# Patient Record
Sex: Male | Born: 1955 | Race: White | Hispanic: No | Marital: Single | Smoking: Never smoker
Health system: Southern US, Community
[De-identification: ages and names within clinical notes are randomized; demographics above are authoritative.]

## PROBLEM LIST (undated history)

## (undated) DIAGNOSIS — E785 Hyperlipidemia, unspecified: Secondary | ICD-10-CM

## (undated) DIAGNOSIS — I10 Essential (primary) hypertension: Secondary | ICD-10-CM

## (undated) HISTORY — DX: Essential (primary) hypertension: I10

## (undated) HISTORY — DX: Hyperlipidemia, unspecified: E78.5

---

## 2009-09-13 ENCOUNTER — Emergency Department: Payer: Self-pay | Admitting: Emergency Medicine

## 2010-05-19 ENCOUNTER — Ambulatory Visit: Payer: Self-pay | Admitting: Gastroenterology

## 2010-05-21 LAB — PATHOLOGY REPORT

## 2011-06-29 IMAGING — CR DG CHEST 2V
1 series · 2 of 2 positions shown · non-contrast
Comparison: none

REASON FOR EXAM: cough, congestion
COMMENTS:   May transport without cardiac monitor

PROCEDURE:     DXR - DXR CHEST PA (OR AP) AND LATERAL  - September 13, 2009  [DATE]
RESULT:     The lung fields are clear. The heart, mediastinal and osseous
structures show no acute changes. Incidental note is made of a slight
thoracic scoliosis with convexity to the right.

[Series 1: view not recorded · 0.17mm/px · 2 of 2 slices shown]
[im 1/2]
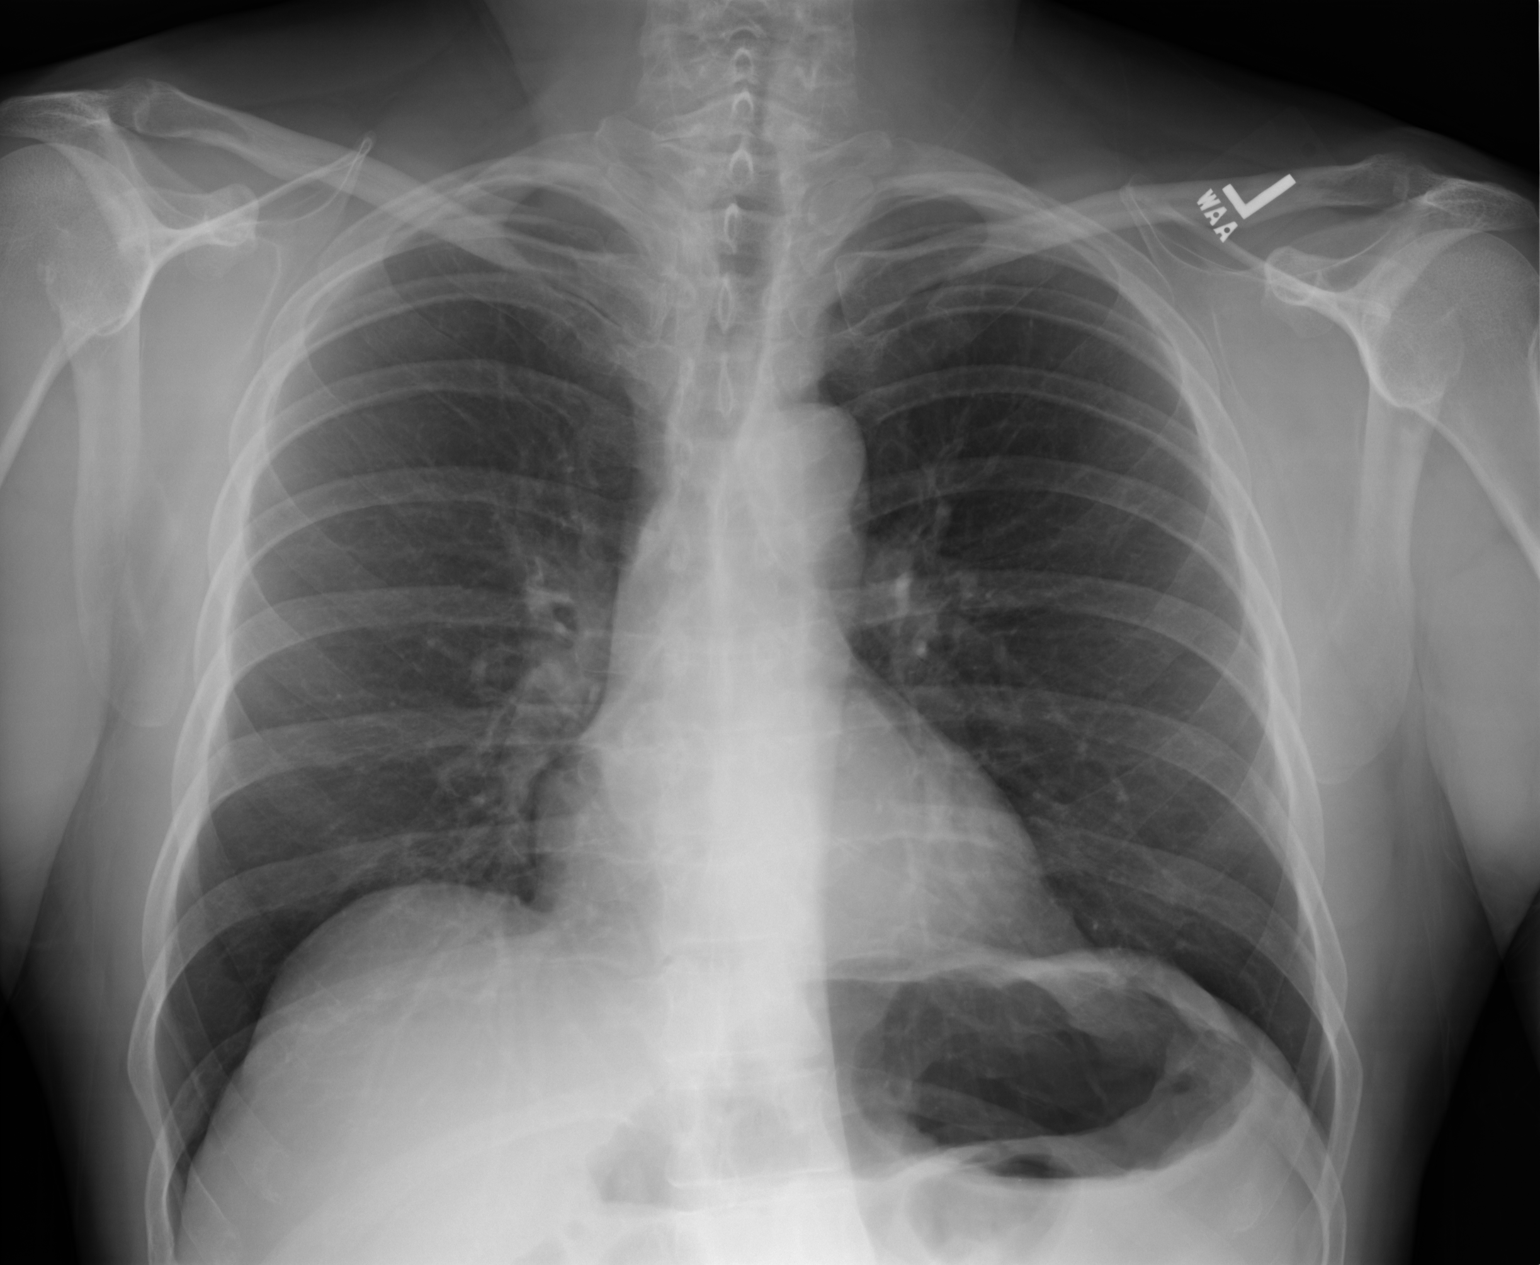
[im 2/2]
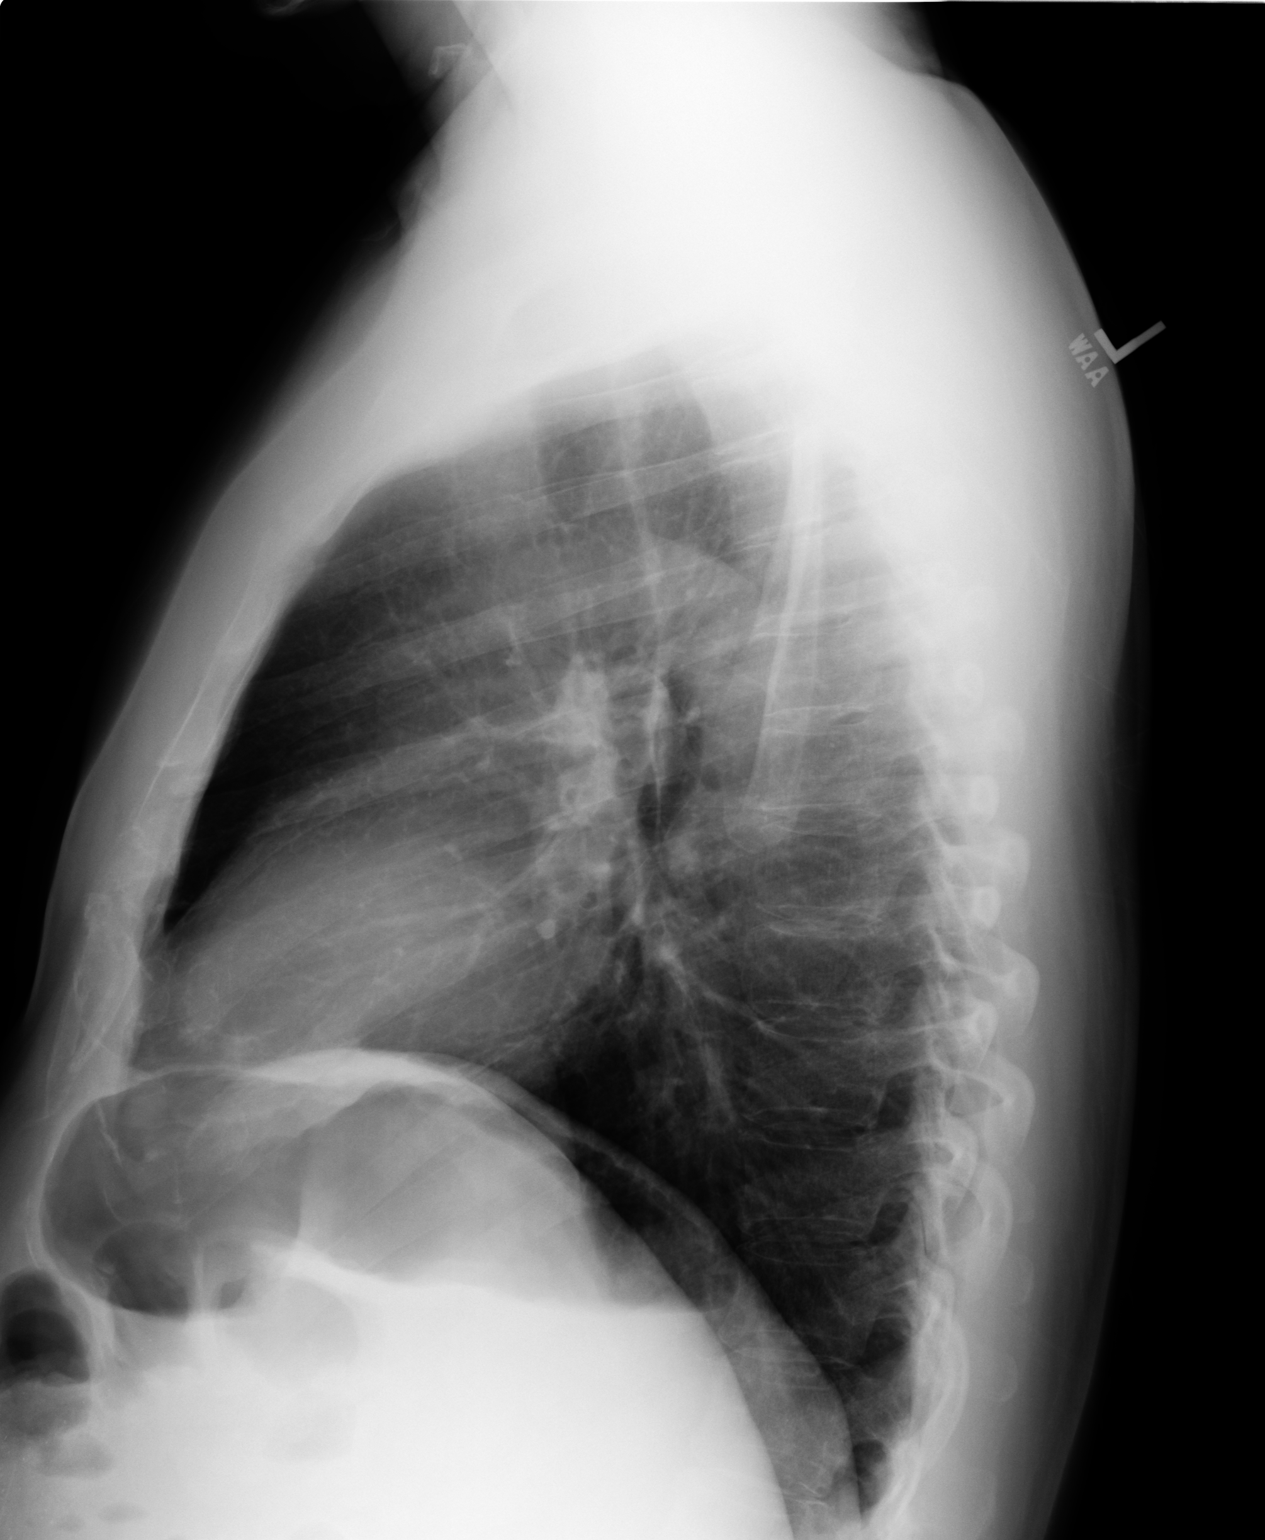

[2 of 2 positions shown; findings below may reference images not displayed]

IMPRESSION: No acute changes are identified.

## 2013-07-04 ENCOUNTER — Ambulatory Visit: Payer: Self-pay | Admitting: Gastroenterology

## 2013-07-04 LAB — HM COLONOSCOPY

## 2013-07-07 LAB — PATHOLOGY REPORT

## 2014-07-06 LAB — PSA

## 2014-09-24 DIAGNOSIS — I10 Essential (primary) hypertension: Secondary | ICD-10-CM | POA: Insufficient documentation

## 2015-03-16 ENCOUNTER — Telehealth: Payer: Self-pay

## 2015-03-16 NOTE — Telephone Encounter (Signed)
I will be doing labs at his physical in February.  I don't think I also want labs on this visit.

## 2015-03-16 NOTE — Telephone Encounter (Signed)
Called and let patient know Elnita MaxwellCheryl would not be doing labs at visit in December.

## 2015-03-16 NOTE — Telephone Encounter (Signed)
Patient came in and stated that he needs lab orders mailed to him. Patient is coming in for 6 month f/u on 04/12/15 and practice partner number is [phone]. Elnita MaxwellCheryl, can you put in orders for this patient and then I will print them and mail them to him please?

## 2015-04-02 DIAGNOSIS — I10 Essential (primary) hypertension: Secondary | ICD-10-CM | POA: Insufficient documentation

## 2015-04-02 DIAGNOSIS — E782 Mixed hyperlipidemia: Secondary | ICD-10-CM | POA: Insufficient documentation

## 2015-04-02 DIAGNOSIS — E785 Hyperlipidemia, unspecified: Secondary | ICD-10-CM

## 2015-04-12 ENCOUNTER — Ambulatory Visit (INDEPENDENT_AMBULATORY_CARE_PROVIDER_SITE_OTHER): Payer: PRIVATE HEALTH INSURANCE | Admitting: Unknown Physician Specialty

## 2015-04-12 ENCOUNTER — Encounter: Payer: Self-pay | Admitting: Unknown Physician Specialty

## 2015-04-12 VITALS — BP 178/84 | HR 65 | Temp 98.5°F | Ht 65.8 in | Wt 152.6 lb

## 2015-04-12 DIAGNOSIS — I10 Essential (primary) hypertension: Secondary | ICD-10-CM | POA: Diagnosis not present

## 2015-04-12 DIAGNOSIS — E785 Hyperlipidemia, unspecified: Secondary | ICD-10-CM

## 2015-04-12 NOTE — Progress Notes (Signed)
BP 178/84 mmHg  Pulse 65  Temp(Src) 98.5 F (36.9 C)  Ht 5' 5.8" (1.671 m)  Wt 152 lb 9.6 oz (69.219 kg)  BMI 24.79 kg/m2  SpO2 99%   Subjective:    Patient ID: Warren Richardson, male    DOB: 1955-10-14, 59 y.o.   MRN: [id number]  HPI: Warren Richardson is a 59 y.o. male  Chief Complaint  Patient presents with  . Hyperlipidemia  . Hypertension   Hypertension Using medications without difficulty Average home BPs 130's/80s  No problems or lightheadedness No chest pain with exertion or shortness of breath No Edema  Relevant past medical, surgical, family and social history reviewed and updated as indicated. Interim medical history since our last visit reviewed. Allergies and medications reviewed and updated.  Review of Systems  Per HPI unless specifically indicated above     Objective:    BP 178/84 mmHg  Pulse 65  Temp(Src) 98.5 F (36.9 C)  Ht 5' 5.8" (1.671 m)  Wt 152 lb 9.6 oz (69.219 kg)  BMI 24.79 kg/m2  SpO2 99%  Wt Readings from Last 3 Encounters:  04/12/15 152 lb 9.6 oz (69.219 kg)  10/06/14 148 lb (67.132 kg)    Physical Exam  Constitutional: He is oriented to person, place, and time. He appears well-developed and well-nourished. No distress.  HENT:  Head: Normocephalic and atraumatic.  Eyes: Conjunctivae and lids are normal. Right eye exhibits no discharge. Left eye exhibits no discharge. No scleral icterus.  Neck: Normal range of motion. Neck supple. No JVD present. Carotid bruit is not present.  Cardiovascular: Normal rate, regular rhythm and normal heart sounds.   Pulmonary/Chest: Effort normal and breath sounds normal. No respiratory distress.  Abdominal: Normal appearance. There is no splenomegaly or hepatomegaly.  Musculoskeletal: Normal range of motion.  Neurological: He is alert and oriented to person, place, and time.  Skin: Skin is warm, dry and intact. No rash noted. No pallor.  Psychiatric: He has a normal mood and affect. His  behavior is normal. Judgment and thought content normal.      Assessment & Plan:   Problem List Items Addressed This Visit      Unprioritized   Hypertension - Primary    BP very high here in the office but good BP at home.  We will recheck in 1 month.  Bring in cuff for correlation.  No labs today.  Check BP daily      Relevant Medications   aspirin 81 MG chewable tablet   Hyperlipidemia    Not fasting.  Recheck next month      Relevant Medications   aspirin 81 MG chewable tablet     Order written for labs   Follow up plan: Return in about 4 weeks (around 05/10/2015).

## 2015-04-12 NOTE — Assessment & Plan Note (Addendum)
BP very high here in the office but good BP at home.  We will recheck in 1 month.  Bring in cuff for correlation.  No labs today.  Check BP daily

## 2015-04-12 NOTE — Assessment & Plan Note (Signed)
Not fasting.  Recheck next month

## 2015-05-12 ENCOUNTER — Other Ambulatory Visit: Payer: Self-pay

## 2015-05-12 DIAGNOSIS — Z299 Encounter for prophylactic measures, unspecified: Secondary | ICD-10-CM

## 2015-05-12 NOTE — Progress Notes (Signed)
Patient came in to have blood drawn per [facility]  Blood was drawn from the right arm without any incident.

## 2015-05-13 LAB — COMPREHENSIVE METABOLIC PANEL
A/G RATIO: 2.3 (ref 1.1–2.5)
ALBUMIN: 4.9 g/dL (ref 3.5–5.5)
ALT: 12 IU/L (ref 0–44)
AST: 37 IU/L (ref 0–40)
Alkaline Phosphatase: 65 IU/L (ref 39–117)
BILIRUBIN TOTAL: 0.4 mg/dL (ref 0.0–1.2)
BUN / CREAT RATIO: 16 (ref 9–20)
BUN: 18 mg/dL (ref 6–24)
CHLORIDE: 95 mmol/L — AB (ref 96–106)
CO2: 26 mmol/L (ref 18–29)
Calcium: 10.1 mg/dL (ref 8.7–10.2)
Creatinine, Ser: 1.1 mg/dL (ref 0.76–1.27)
GFR calc non Af Amer: 73 mL/min/{1.73_m2} (ref >59)
GFR, EST AFRICAN AMERICAN: 84 mL/min/{1.73_m2} (ref >59)
GLOBULIN, TOTAL: 2.1 g/dL (ref 1.5–4.5)
Glucose: 101 mg/dL — ABNORMAL HIGH (ref 65–99)
POTASSIUM: 4.9 mmol/L (ref 3.5–5.2)
SODIUM: 139 mmol/L (ref 134–144)
TOTAL PROTEIN: 7 g/dL (ref 6.0–8.5)

## 2015-05-13 LAB — CBC WITH DIFFERENTIAL/PLATELET
BASOS: 0 %
Basophils Absolute: 0 10*3/uL (ref 0.0–0.2)
EOS (ABSOLUTE): 0.1 10*3/uL (ref 0.0–0.4)
Eos: 1 %
Hematocrit: 41.3 % (ref 37.5–51.0)
Hemoglobin: 14.2 g/dL (ref 12.6–17.7)
IMMATURE GRANS (ABS): 0 10*3/uL (ref 0.0–0.1)
Immature Granulocytes: 0 %
LYMPHS ABS: 1.1 10*3/uL (ref 0.7–3.1)
LYMPHS: 26 %
MCH: 33 pg (ref 26.6–33.0)
MCHC: 34.4 g/dL (ref 31.5–35.7)
MCV: 96 fL (ref 79–97)
Monocytes Absolute: 0.5 10*3/uL (ref 0.1–0.9)
Monocytes: 12 %
NEUTROS ABS: 2.5 10*3/uL (ref 1.4–7.0)
Neutrophils: 61 %
PLATELETS: 214 10*3/uL (ref 150–379)
RBC: 4.3 x10E6/uL (ref 4.14–5.80)
RDW: 13.6 % (ref 12.3–15.4)
WBC: 4.1 10*3/uL (ref 3.4–10.8)

## 2015-05-13 LAB — MICROALBUMIN / CREATININE URINE RATIO
Creatinine, Urine: 131.3 mg/dL
MICROALB/CREAT RATIO: 2.7 mg/g creat (ref 0.0–30.0)
Microalbumin, Urine: 3.6 ug/mL

## 2015-05-13 LAB — URIC ACID: Uric Acid: 7.6 mg/dL (ref 3.7–8.6)

## 2015-05-17 ENCOUNTER — Ambulatory Visit: Payer: PRIVATE HEALTH INSURANCE | Admitting: Unknown Physician Specialty

## 2015-05-19 ENCOUNTER — Encounter: Payer: Self-pay | Admitting: Unknown Physician Specialty

## 2015-05-19 ENCOUNTER — Ambulatory Visit (INDEPENDENT_AMBULATORY_CARE_PROVIDER_SITE_OTHER): Payer: Managed Care, Other (non HMO) | Admitting: Unknown Physician Specialty

## 2015-05-19 VITALS — BP 162/85 | HR 65 | Temp 98.2°F | Ht 65.7 in | Wt 153.0 lb

## 2015-05-19 DIAGNOSIS — I10 Essential (primary) hypertension: Secondary | ICD-10-CM

## 2015-05-19 NOTE — Assessment & Plan Note (Addendum)
Apparent white coat hypertension.  Discussed relationship of ETOH with BP.

## 2015-05-19 NOTE — Progress Notes (Signed)
BP 162/85 mmHg  Pulse 65  Temp(Src) 98.2 F (36.8 C)  Ht 5' 5.7" (1.669 m)  Wt 153 lb (69.4 kg)  BMI 24.91 kg/m2  SpO2 98%   Subjective:    Patient ID: Warren Richardson, male    DOB: 1956/04/02, 60 y.o.   MRN: [id number]  HPI: Warren Richardson is a 60 y.o. male  Chief Complaint  Patient presents with  . Hypertension    pt states he has been checking BP at home and states it has been all over the place.   Hypertension Using medications without difficulty Average home BPs somewhat erratic but typically below 140/80.  Brought BP cuff in today for correlation with office BP   No problems or lightheadedness No chest pain with exertion or shortness of breath No Edema     Relevant past medical, surgical, family and social history reviewed and updated as indicated. Interim medical history since our last visit reviewed. Allergies and medications reviewed and updated.  Review of Systems  Per HPI unless specifically indicated above     Objective:    BP 162/85 mmHg  Pulse 65  Temp(Src) 98.2 F (36.8 C)  Ht 5' 5.7" (1.669 m)  Wt 153 lb (69.4 kg)  BMI 24.91 kg/m2  SpO2 98%  Wt Readings from Last 3 Encounters:  05/19/15 153 lb (69.4 kg)  04/12/15 152 lb 9.6 oz (69.219 kg)  10/06/14 148 lb (67.132 kg)    Home cuff pressure high as well.    Physical Exam  Constitutional: He is oriented to person, place, and time. He appears well-developed and well-nourished. No distress.  HENT:  Head: Normocephalic and atraumatic.  Eyes: Conjunctivae and lids are normal. Right eye exhibits no discharge. Left eye exhibits no discharge. No scleral icterus.  Neck: Normal range of motion. Neck supple. No JVD present. Carotid bruit is not present.  Cardiovascular: Normal rate, regular rhythm and normal heart sounds.   Pulmonary/Chest: Effort normal and breath sounds normal. No respiratory distress.  Abdominal: Normal appearance. There is no splenomegaly or hepatomegaly.   Musculoskeletal: Normal range of motion.  Neurological: He is alert and oriented to person, place, and time.  Skin: Skin is warm, dry and intact. No rash noted. No pallor.  Psychiatric: He has a normal mood and affect. His behavior is normal. Judgment and thought content normal.    Results for orders placed or performed in visit on 05/12/15  Microalbumin / creatinine urine ratio  Result Value Ref Range   Creatinine, Urine 131.3 Not Estab. mg/dL   Microalbum.,U,Random 3.6 Not Estab. ug/mL   MICROALB/CREAT RATIO 2.7 0.0 - 30.0 mg/g creat  Comp Met (CMET)  Result Value Ref Range   Glucose 101 (H) 65 - 99 mg/dL   BUN 18 6 - 24 mg/dL   Creatinine, Ser 7.84 0.76 - 1.27 mg/dL   GFR calc non Af Amer 73 >59 mL/min/1.73   GFR calc Af Amer 84 >59 mL/min/1.73   BUN/Creatinine Ratio 16 9 - 20   Sodium 139 134 - 144 mmol/L   Potassium 4.9 3.5 - 5.2 mmol/L   Chloride 95 (L) 96 - 106 mmol/L   CO2 26 18 - 29 mmol/L   Calcium 10.1 8.7 - 10.2 mg/dL   Total Protein 7.0 6.0 - 8.5 g/dL   Albumin 4.9 3.5 - 5.5 g/dL   Globulin, Total 2.1 1.5 - 4.5 g/dL   Albumin/Globulin Ratio 2.3 1.1 - 2.5   Bilirubin Total 0.4 0.0 - 1.2 mg/dL  Alkaline Phosphatase 65 39 - 117 IU/L   AST 37 0 - 40 IU/L   ALT 12 0 - 44 IU/L  Uric acid  Result Value Ref Range   Uric Acid 7.6 3.7 - 8.6 mg/dL  CBC with Differential  Result Value Ref Range   WBC 4.1 3.4 - 10.8 x10E3/uL   RBC 4.30 4.14 - 5.80 x10E6/uL   Hemoglobin 14.2 12.6 - 17.7 g/dL   Hematocrit 40.9 81.1 - 51.0 %   MCV 96 79 - 97 fL   MCH 33.0 26.6 - 33.0 pg   MCHC 34.4 31.5 - 35.7 g/dL   RDW 91.4 78.2 - 95.6 %   Platelets 214 150 - 379 x10E3/uL   Neutrophils 61 %   Lymphs 26 %   Monocytes 12 %   Eos 1 %   Basos 0 %   Neutrophils Absolute 2.5 1.4 - 7.0 x10E3/uL   Lymphocytes Absolute 1.1 0.7 - 3.1 x10E3/uL   Monocytes Absolute 0.5 0.1 - 0.9 x10E3/uL   EOS (ABSOLUTE) 0.1 0.0 - 0.4 x10E3/uL   Basophils Absolute 0.0 0.0 - 0.2 x10E3/uL   Immature  Granulocytes 0 %   Immature Grans (Abs) 0.0 0.0 - 0.1 x10E3/uL      Assessment & Plan:   Problem List Items Addressed This Visit      Unprioritized   Hypertension - Primary    Apparent white coat hypertension.  Discussed relationship of ETOH with BP.            Follow up plan: Return for physical in March.  Marland Kitchen

## 2015-07-09 ENCOUNTER — Ambulatory Visit (INDEPENDENT_AMBULATORY_CARE_PROVIDER_SITE_OTHER): Payer: Managed Care, Other (non HMO) | Admitting: Unknown Physician Specialty

## 2015-07-09 ENCOUNTER — Encounter: Payer: Self-pay | Admitting: Unknown Physician Specialty

## 2015-07-09 VITALS — BP 120/74 | HR 64 | Temp 98.0°F | Ht 65.2 in | Wt 150.6 lb

## 2015-07-09 DIAGNOSIS — I10 Essential (primary) hypertension: Secondary | ICD-10-CM

## 2015-07-09 DIAGNOSIS — E785 Hyperlipidemia, unspecified: Secondary | ICD-10-CM | POA: Diagnosis not present

## 2015-07-09 DIAGNOSIS — Z0000 Encounter for general adult medical examination without abnormal findings: Secondary | ICD-10-CM | POA: Diagnosis not present

## 2015-07-09 DIAGNOSIS — R7301 Impaired fasting glucose: Secondary | ICD-10-CM

## 2015-07-09 NOTE — Assessment & Plan Note (Signed)
Stable, continue present medications.   

## 2015-07-09 NOTE — Progress Notes (Signed)
BP 120/74 mmHg  Pulse 64  Temp(Src) 98 F (36.7 C)  Ht 5' 5.2" (1.656 m)  Wt 150 lb 9.6 oz (68.312 kg)  BMI 24.91 kg/m2  SpO2 100%   Subjective:    Patient ID: Warren Richardson, male    DOB: 1955/09/18, 60 y.o.   MRN: [id number]  HPI: Warren Richardson is a 60 y.o. male  Chief Complaint  Patient presents with  . Annual Exam   Hypertension Using medications without difficulty: yes Average home BPs: 110-120/ 60-70s  No problems or lightheadedness No chest pain with exertion or shortness of breath No Edema  Hyperlipidemia Using medications without problems: yes No Muscle aches  Diet compliance: Eats home-cooked meals and occasionally will eat out on weekend. Does not eat added sugar at all. No sweets.                                               Exercise: Does cardio on treadmill and bike every day and lifts weights 3 days a week  Patient refused HIV and Hep C screening.   Relevant past medical, surgical, family and social history reviewed and updated as indicated. Interim medical history since our last visit reviewed. Allergies and medications reviewed and updated.  Review of Systems  Constitutional: Negative.   HENT: Negative.   Eyes: Negative.   Respiratory: Negative.   Cardiovascular: Negative.   Gastrointestinal: Negative.   Endocrine: Negative.   Genitourinary: Negative.   Skin: Negative.   Allergic/Immunologic: Negative.   Neurological: Negative.   Hematological: Negative.   Psychiatric/Behavioral: Negative.     Per HPI unless specifically indicated above     Objective:    BP 120/74 mmHg  Pulse 64  Temp(Src) 98 F (36.7 C)  Ht 5' 5.2" (1.656 m)  Wt 150 lb 9.6 oz (68.312 kg)  BMI 24.91 kg/m2  SpO2 100%  Wt Readings from Last 3 Encounters:  07/09/15 150 lb 9.6 oz (68.312 kg)  05/19/15 153 lb (69.4 kg)  04/12/15 152 lb 9.6 oz (69.219 kg)    Physical Exam  Constitutional: He is oriented to person, place, and time. He appears  well-developed and well-nourished.  HENT:  Head: Normocephalic.  Right Ear: Tympanic membrane, external ear and ear canal normal.  Left Ear: Tympanic membrane, external ear and ear canal normal.  Mouth/Throat: Uvula is midline, oropharynx is clear and moist and mucous membranes are normal.  Eyes: Pupils are equal, round, and reactive to light.  Cardiovascular: Normal rate, regular rhythm and normal heart sounds.  Exam reveals no gallop and no friction rub.   No murmur heard. Pulmonary/Chest: Effort normal and breath sounds normal. No respiratory distress.  Abdominal: Soft. Bowel sounds are normal. He exhibits no distension. There is no tenderness.  Musculoskeletal: Normal range of motion.  Neurological: He is alert and oriented to person, place, and time. He has normal reflexes.  Skin: Skin is warm and dry.  Psychiatric: He has a normal mood and affect. His behavior is normal. Judgment and thought content normal.    Results for orders placed or performed in visit on 05/12/15  Microalbumin / creatinine urine ratio  Result Value Ref Range   Creatinine, Urine 131.3 Not Estab. mg/dL   Microalbum.,U,Random 3.6 Not Estab. ug/mL   MICROALB/CREAT RATIO 2.7 0.0 - 30.0 mg/g creat  Comp Met (CMET)  Result Value Ref Range  Glucose 101 (H) 65 - 99 mg/dL   BUN 18 6 - 24 mg/dL   Creatinine, Ser 2.95 0.76 - 1.27 mg/dL   GFR calc non Af Amer 73 >59 mL/min/1.73   GFR calc Af Amer 84 >59 mL/min/1.73   BUN/Creatinine Ratio 16 9 - 20   Sodium 139 134 - 144 mmol/L   Potassium 4.9 3.5 - 5.2 mmol/L   Chloride 95 (L) 96 - 106 mmol/L   CO2 26 18 - 29 mmol/L   Calcium 10.1 8.7 - 10.2 mg/dL   Total Protein 7.0 6.0 - 8.5 g/dL   Albumin 4.9 3.5 - 5.5 g/dL   Globulin, Total 2.1 1.5 - 4.5 g/dL   Albumin/Globulin Ratio 2.3 1.1 - 2.5   Bilirubin Total 0.4 0.0 - 1.2 mg/dL   Alkaline Phosphatase 65 39 - 117 IU/L   AST 37 0 - 40 IU/L   ALT 12 0 - 44 IU/L  Uric acid  Result Value Ref Range   Uric Acid 7.6  3.7 - 8.6 mg/dL  CBC with Differential  Result Value Ref Range   WBC 4.1 3.4 - 10.8 x10E3/uL   RBC 4.30 4.14 - 5.80 x10E6/uL   Hemoglobin 14.2 12.6 - 17.7 g/dL   Hematocrit 62.1 30.8 - 51.0 %   MCV 96 79 - 97 fL   MCH 33.0 26.6 - 33.0 pg   MCHC 34.4 31.5 - 35.7 g/dL   RDW 65.7 84.6 - 96.2 %   Platelets 214 150 - 379 x10E3/uL   Neutrophils 61 %   Lymphs 26 %   Monocytes 12 %   Eos 1 %   Basos 0 %   Neutrophils Absolute 2.5 1.4 - 7.0 x10E3/uL   Lymphocytes Absolute 1.1 0.7 - 3.1 x10E3/uL   Monocytes Absolute 0.5 0.1 - 0.9 x10E3/uL   EOS (ABSOLUTE) 0.1 0.0 - 0.4 x10E3/uL   Basophils Absolute 0.0 0.0 - 0.2 x10E3/uL   Immature Granulocytes 0 %   Immature Grans (Abs) 0.0 0.0 - 0.1 x10E3/uL      Assessment & Plan:   Problem List Items Addressed This Visit      Unprioritized   Hypertension    Stable, continue present medications.        Hyperlipidemia    Other Visit Diagnoses    Annual physical exam    -  Primary    IFG (impaired fasting glucose)            Follow up plan: Return in about 6 months (around 01/09/2016).

## 2015-07-17 ENCOUNTER — Other Ambulatory Visit: Payer: Self-pay | Admitting: Unknown Physician Specialty

## 2015-08-18 ENCOUNTER — Other Ambulatory Visit: Payer: Self-pay | Admitting: Unknown Physician Specialty

## 2015-12-18 ENCOUNTER — Other Ambulatory Visit: Payer: Self-pay | Admitting: Unknown Physician Specialty

## 2016-01-17 ENCOUNTER — Other Ambulatory Visit: Payer: Self-pay | Admitting: Unknown Physician Specialty

## 2016-01-17 ENCOUNTER — Other Ambulatory Visit: Payer: Self-pay

## 2016-01-17 DIAGNOSIS — Z299 Encounter for prophylactic measures, unspecified: Secondary | ICD-10-CM

## 2016-01-17 NOTE — Progress Notes (Signed)
Patient came in to have blood drawn for testing per Dr. Crissman's orders. 

## 2016-01-18 LAB — CMP12+LP+TP+TSH+6AC+PSA+CBC…
A/G RATIO: 2.2 (ref 1.2–2.2)
ALBUMIN: 4.7 g/dL (ref 3.6–4.8)
ALT: 11 IU/L (ref 0–44)
AST: 31 IU/L (ref 0–40)
Alkaline Phosphatase: 67 IU/L (ref 39–117)
BASOS: 0 %
BILIRUBIN TOTAL: 0.7 mg/dL (ref 0.0–1.2)
BUN / CREAT RATIO: 19 (ref 10–24)
BUN: 21 mg/dL (ref 8–27)
Basophils Absolute: 0 10*3/uL (ref 0.0–0.2)
CHOL/HDL RATIO: 2.4 ratio (ref 0.0–5.0)
Calcium: 9.3 mg/dL (ref 8.6–10.2)
Chloride: 95 mmol/L — ABNORMAL LOW (ref 96–106)
Cholesterol, Total: 207 mg/dL — ABNORMAL HIGH (ref 100–199)
Creatinine, Ser: 1.11 mg/dL (ref 0.76–1.27)
EOS (ABSOLUTE): 0.1 10*3/uL (ref 0.0–0.4)
EOS: 2 %
Free Thyroxine Index: 1.7 (ref 1.2–4.9)
GFR calc non Af Amer: 72 mL/min/{1.73_m2} (ref >59)
GFR, EST AFRICAN AMERICAN: 83 mL/min/{1.73_m2} (ref >59)
GGT: 24 IU/L (ref 0–65)
GLOBULIN, TOTAL: 2.1 g/dL (ref 1.5–4.5)
GLUCOSE: 98 mg/dL (ref 65–99)
HDL: 85 mg/dL (ref >39)
HEMATOCRIT: 40.1 % (ref 37.5–51.0)
HEMOGLOBIN: 13.8 g/dL (ref 12.6–17.7)
IMMATURE GRANULOCYTES: 0 %
Immature Grans (Abs): 0 10*3/uL (ref 0.0–0.1)
Iron: 182 ug/dL — ABNORMAL HIGH (ref 38–169)
LDH: 174 IU/L (ref 121–224)
LDL CALC: 113 mg/dL — AB (ref 0–99)
LYMPHS ABS: 1.2 10*3/uL (ref 0.7–3.1)
Lymphs: 25 %
MCH: 32.1 pg (ref 26.6–33.0)
MCHC: 34.4 g/dL (ref 31.5–35.7)
MCV: 93 fL (ref 79–97)
MONOCYTES: 10 %
Monocytes Absolute: 0.5 10*3/uL (ref 0.1–0.9)
NEUTROS PCT: 63 %
Neutrophils Absolute: 3 10*3/uL (ref 1.4–7.0)
PHOSPHORUS: 3.3 mg/dL (ref 2.5–4.5)
PROSTATE SPECIFIC AG, SERUM: 2.7 ng/mL (ref 0.0–4.0)
Platelets: 237 10*3/uL (ref 150–379)
Potassium: 4.7 mmol/L (ref 3.5–5.2)
RBC: 4.3 x10E6/uL (ref 4.14–5.80)
RDW: 14.4 % (ref 12.3–15.4)
SODIUM: 138 mmol/L (ref 134–144)
T3 Uptake Ratio: 26 % (ref 24–39)
T4, Total: 6.7 ug/dL (ref 4.5–12.0)
TOTAL PROTEIN: 6.8 g/dL (ref 6.0–8.5)
TSH: 1.96 u[IU]/mL (ref 0.450–4.500)
Triglycerides: 47 mg/dL (ref 0–149)
Uric Acid: 7.4 mg/dL (ref 3.7–8.6)
VLDL Cholesterol Cal: 9 mg/dL (ref 5–40)
WBC: 4.8 10*3/uL (ref 3.4–10.8)

## 2016-01-18 LAB — HEPATITIS C ANTIBODY (REFLEX)

## 2016-01-18 LAB — MICROALBUMIN / CREATININE URINE RATIO
CREATININE, UR: 90.5 mg/dL
MICROALB/CREAT RATIO: 10.6 mg/g creat (ref 0.0–30.0)
MICROALBUM., U, RANDOM: 9.6 ug/mL

## 2016-01-18 LAB — HGB A1C W/O EAG: HEMOGLOBIN A1C: 5.3 % (ref 4.8–5.6)

## 2016-01-18 LAB — HIV ANTIBODY (ROUTINE TESTING W REFLEX): HIV Screen 4th Generation wRfx: NONREACTIVE

## 2016-01-18 LAB — HCV COMMENT:

## 2016-01-21 ENCOUNTER — Ambulatory Visit (INDEPENDENT_AMBULATORY_CARE_PROVIDER_SITE_OTHER): Payer: Managed Care, Other (non HMO) | Admitting: Unknown Physician Specialty

## 2016-01-21 ENCOUNTER — Encounter: Payer: Self-pay | Admitting: Unknown Physician Specialty

## 2016-01-21 VITALS — BP 129/74 | HR 62 | Temp 97.9°F | Ht 67.2 in | Wt 151.8 lb

## 2016-01-21 DIAGNOSIS — E785 Hyperlipidemia, unspecified: Secondary | ICD-10-CM | POA: Diagnosis not present

## 2016-01-21 DIAGNOSIS — Z0000 Encounter for general adult medical examination without abnormal findings: Secondary | ICD-10-CM

## 2016-01-21 DIAGNOSIS — I10 Essential (primary) hypertension: Secondary | ICD-10-CM | POA: Diagnosis not present

## 2016-01-21 NOTE — Progress Notes (Signed)
BP 129/74 (BP Location: Left Arm, Patient Position: Sitting, Cuff Size: Normal)   Pulse 62   Temp 97.9 F (36.6 C)   Ht 5' 7.2" (1.707 m) Comment: pt had shoes on  Wt 151 lb 12.8 oz (68.9 kg) Comment: pt had shoes on  SpO2 100%   BMI 23.63 kg/m    Subjective:    Patient ID: Warren Richardson, male    DOB: 1955-10-15, 60 y.o.   MRN: [id number]  HPI: Warren Richardson is a 60 y.o. male  Chief Complaint  Patient presents with  . Hyperlipidemia  . Hypertension   Hypertension Using medications without difficulty Average home BPs below 130/80  No problems or lightheadedness No chest pain with exertion or shortness of breath No Edema   Hyperlipidemia Using medications without problems No Muscle aches  Diet compliance: watches what he eats Exercise: daily  Relevant past medical, surgical, family and social history reviewed and updated as indicated. Interim medical history since our last visit reviewed. Allergies and medications reviewed and updated.  Review of Systems  Per HPI unless specifically indicated above     Objective:    BP 129/74 (BP Location: Left Arm, Patient Position: Sitting, Cuff Size: Normal)   Pulse 62   Temp 97.9 F (36.6 C)   Ht 5' 7.2" (1.707 m) Comment: pt had shoes on  Wt 151 lb 12.8 oz (68.9 kg) Comment: pt had shoes on  SpO2 100%   BMI 23.63 kg/m   Wt Readings from Last 3 Encounters:  01/21/16 151 lb 12.8 oz (68.9 kg)  07/09/15 150 lb 9.6 oz (68.3 kg)  05/19/15 153 lb (69.4 kg)    Physical Exam  Constitutional: He is oriented to person, place, and time. He appears well-developed and well-nourished. No distress.  HENT:  Head: Normocephalic and atraumatic.  Eyes: Conjunctivae and lids are normal. Right eye exhibits no discharge. Left eye exhibits no discharge. No scleral icterus.  Neck: Normal range of motion. Neck supple. No JVD present. Carotid bruit is not present.  Cardiovascular: Normal rate, regular rhythm and normal heart  sounds.   Pulmonary/Chest: Effort normal and breath sounds normal. No respiratory distress.  Abdominal: Normal appearance. There is no splenomegaly or hepatomegaly.  Musculoskeletal: Normal range of motion.  Neurological: He is alert and oriented to person, place, and time.  Skin: Skin is warm, dry and intact. No rash noted. No pallor.  Psychiatric: He has a normal mood and affect. His behavior is normal. Judgment and thought content normal.    Results for orders placed or performed in visit on 01/17/16  Hepatitis C antibody (reflex)  Result Value Ref Range   HCV Ab <0.1 0.0 - 0.9 s/co ratio  Hemoglobin A1c  Result Value Ref Range   Hgb A1c MFr Bld 5.3 4.8 - 5.6 %  Microalbumin / creatinine urine ratio  Result Value Ref Range   Creatinine, Urine 90.5 Not Estab. mg/dL   Microalbum.,U,Random 9.6 Not Estab. ug/mL   MICROALB/CREAT RATIO 10.6 0.0 - 30.0 mg/g creat  HIV antibody (with reflex)  Result Value Ref Range   HIV Screen 4th Generation wRfx Non Reactive Non Reactive  Male Executive Panel  Result Value Ref Range   Glucose 98 65 - 99 mg/dL   Uric Acid 7.4 3.7 - 8.6 mg/dL   BUN 21 8 - 27 mg/dL   Creatinine, Ser 2.95 0.76 - 1.27 mg/dL   GFR calc non Af Amer 72 >59 mL/min/1.73   GFR calc Af Amer 83 >  59 mL/min/1.73   BUN/Creatinine Ratio 19 10 - 24   Sodium 138 134 - 144 mmol/L   Potassium 4.7 3.5 - 5.2 mmol/L   Chloride 95 (L) 96 - 106 mmol/L   Calcium 9.3 8.6 - 10.2 mg/dL   Phosphorus 3.3 2.5 - 4.5 mg/dL   Total Protein 6.8 6.0 - 8.5 g/dL   Albumin 4.7 3.6 - 4.8 g/dL   Globulin, Total 2.1 1.5 - 4.5 g/dL   Albumin/Globulin Ratio 2.2 1.2 - 2.2   Bilirubin Total 0.7 0.0 - 1.2 mg/dL   Alkaline Phosphatase 67 39 - 117 IU/L   LDH 174 121 - 224 IU/L   AST 31 0 - 40 IU/L   ALT 11 0 - 44 IU/L   GGT 24 0 - 65 IU/L   Iron 182 (H) 38 - 169 ug/dL   Cholesterol, Total 161 (H) 100 - 199 mg/dL   Triglycerides 47 0 - 149 mg/dL   HDL 85 >09 mg/dL   VLDL Cholesterol Cal 9 5 - 40 mg/dL     LDL Calculated 604 (H) 0 - 99 mg/dL   Chol/HDL Ratio 2.4 0.0 - 5.0 ratio units   Estimated CHD Risk  < 0.5 0.0 - 1.0  times avg.   TSH 1.960 0.450 - 4.500 uIU/mL   T4, Total 6.7 4.5 - 12.0 ug/dL   T3 Uptake Ratio 26 24 - 39 %   Free Thyroxine Index 1.7 1.2 - 4.9   Prostate Specific Ag, Serum 2.7 0.0 - 4.0 ng/mL   WBC 4.8 3.4 - 10.8 x10E3/uL   RBC 4.30 4.14 - 5.80 x10E6/uL   Hemoglobin 13.8 12.6 - 17.7 g/dL   Hematocrit 54.0 98.1 - 51.0 %   MCV 93 79 - 97 fL   MCH 32.1 26.6 - 33.0 pg   MCHC 34.4 31.5 - 35.7 g/dL   RDW 19.1 47.8 - 29.5 %   Platelets 237 150 - 379 x10E3/uL   Neutrophils 63 %   Lymphs 25 %   Monocytes 10 %   Eos 2 %   Basos 0 %   Neutrophils Absolute 3.0 1.4 - 7.0 x10E3/uL   Lymphocytes Absolute 1.2 0.7 - 3.1 x10E3/uL   Monocytes Absolute 0.5 0.1 - 0.9 x10E3/uL   EOS (ABSOLUTE) 0.1 0.0 - 0.4 x10E3/uL   Basophils Absolute 0.0 0.0 - 0.2 x10E3/uL   Immature Granulocytes 0 %   Immature Grans (Abs) 0.0 0.0 - 0.1 x10E3/uL  HCV Comment:  Result Value Ref Range   Comment: Comment       Assessment & Plan:   Problem List Items Addressed This Visit      Unprioritized   Hyperlipidemia    LDL is 113.  Continue present medications      Hypertension    Stable, continue present medications.         Other Visit Diagnoses   None.      Follow up plan: Return in about 6 months (around 07/20/2016) for for physical.

## 2016-01-21 NOTE — Assessment & Plan Note (Signed)
LDL is 113.  Continue present medications

## 2016-01-21 NOTE — Assessment & Plan Note (Signed)
Stable, continue present medications.   

## 2016-03-19 ENCOUNTER — Other Ambulatory Visit: Payer: Self-pay | Admitting: Unknown Physician Specialty

## 2016-04-25 ENCOUNTER — Ambulatory Visit: Payer: Self-pay | Admitting: Physician Assistant

## 2016-04-25 ENCOUNTER — Encounter: Payer: Self-pay | Admitting: Physician Assistant

## 2016-04-25 VITALS — BP 141/81 | HR 91 | Temp 98.1°F

## 2016-04-25 DIAGNOSIS — R252 Cramp and spasm: Secondary | ICD-10-CM

## 2016-04-25 NOTE — Progress Notes (Signed)
Subjective:muscle cramps    Patient ID: Warren Richardson, male    DOB: 15-Nov-1955, 60 y.o.   MRN: [id number]  HPI Patient c/o muscle cramps to legs last night. States cramping episode caused tingling in groin area and sweating. Awake this morning with mild tingling. Denies loss of Bladder or bowel control. Patient take Pravachol and was advised that cramping might be a side affect. Patient states Family doctor prescribed Potassium. He does not know the dosage. States at times he is non-compliance.   Review of Systems Hypertension and Hyperlipidema.    Objective:   Physical Exam No acute distress. HEENT unremarkable. Neck supple. Lungs CTA, and Heart RRR. Normal gait and strength of lower extremities. No palpable leg masses.       Assessment & Plan:Muscle cramp.  To lab for CK-MB and BMP. Follow up for lab results. Advised if another severe muscle cramp to report to ED.

## 2016-04-25 NOTE — Addendum Note (Signed)
Addended by: Catha BrowEACON, Zhoey Blackstock T on: 04/25/2016 03:47 PM   Modules accepted: Orders

## 2016-04-26 LAB — CREATININE KINASE MB: CK-MB Index: 3.4 ng/mL (ref 0.0–10.4)

## 2016-04-26 LAB — COMPREHENSIVE METABOLIC PANEL
ALBUMIN: 4.5 g/dL (ref 3.6–4.8)
ALT: 16 IU/L (ref 0–44)
AST: 32 IU/L (ref 0–40)
Albumin/Globulin Ratio: 2 (ref 1.2–2.2)
Alkaline Phosphatase: 60 IU/L (ref 39–117)
BUN / CREAT RATIO: 17 (ref 10–24)
BUN: 18 mg/dL (ref 8–27)
CO2: 25 mmol/L (ref 18–29)
CREATININE: 1.05 mg/dL (ref 0.76–1.27)
Calcium: 9.6 mg/dL (ref 8.6–10.2)
Chloride: 97 mmol/L (ref 96–106)
GFR calc non Af Amer: 77 mL/min/{1.73_m2} (ref >59)
GFR, EST AFRICAN AMERICAN: 89 mL/min/{1.73_m2} (ref >59)
GLUCOSE: 136 mg/dL — AB (ref 65–99)
Globulin, Total: 2.3 g/dL (ref 1.5–4.5)
Potassium: 4.3 mmol/L (ref 3.5–5.2)
Sodium: 140 mmol/L (ref 134–144)
TOTAL PROTEIN: 6.8 g/dL (ref 6.0–8.5)

## 2016-06-23 ENCOUNTER — Other Ambulatory Visit: Payer: Self-pay | Admitting: Unknown Physician Specialty

## 2016-07-03 ENCOUNTER — Other Ambulatory Visit: Payer: Self-pay

## 2016-07-03 VITALS — BP 120/70 | HR 74 | Temp 97.7°F | Ht 67.0 in | Wt 148.0 lb

## 2016-07-03 DIAGNOSIS — Z299 Encounter for prophylactic measures, unspecified: Secondary | ICD-10-CM

## 2016-07-03 NOTE — Progress Notes (Signed)
Patient came in to have blood drawn for testing per Lorenza Evangelistherly Wicker, NP.  Patient also had us fill out his Biometric form.

## 2016-07-04 LAB — CMP12+LP+TP+TSH+6AC+CBC/D/PLT
A/G RATIO: 2.3 — AB (ref 1.2–2.2)
ALK PHOS: 55 IU/L (ref 39–117)
ALT: 16 IU/L (ref 0–44)
AST: 37 IU/L (ref 0–40)
Albumin: 4.6 g/dL (ref 3.6–4.8)
BASOS: 1 %
BUN/Creatinine Ratio: 13 (ref 10–24)
BUN: 14 mg/dL (ref 8–27)
Basophils Absolute: 0 10*3/uL (ref 0.0–0.2)
Bilirubin Total: 0.5 mg/dL (ref 0.0–1.2)
CALCIUM: 9.5 mg/dL (ref 8.6–10.2)
Chloride: 96 mmol/L (ref 96–106)
Chol/HDL Ratio: 2.8 ratio units (ref 0.0–5.0)
Cholesterol, Total: 174 mg/dL (ref 100–199)
Creatinine, Ser: 1.09 mg/dL (ref 0.76–1.27)
EOS (ABSOLUTE): 0 10*3/uL (ref 0.0–0.4)
EOS: 1 %
Estimated CHD Risk: <0.5 times avg. (ref 0.0–1.0)
Free Thyroxine Index: 2.1 (ref 1.2–4.9)
GFR calc non Af Amer: 73 mL/min/{1.73_m2} (ref >59)
GFR, EST AFRICAN AMERICAN: 85 mL/min/{1.73_m2} (ref >59)
GGT: 15 IU/L (ref 0–65)
GLOBULIN, TOTAL: 2 g/dL (ref 1.5–4.5)
GLUCOSE: 73 mg/dL (ref 65–99)
HDL: 62 mg/dL (ref >39)
HEMATOCRIT: 40.5 % (ref 37.5–51.0)
Hemoglobin: 14 g/dL (ref 13.0–17.7)
IMMATURE GRANS (ABS): 0 10*3/uL (ref 0.0–0.1)
Immature Granulocytes: 0 %
Iron: 140 ug/dL (ref 38–169)
LDH: 182 IU/L (ref 121–224)
LDL CALC: 96 mg/dL (ref 0–99)
LYMPHS: 20 %
Lymphocytes Absolute: 0.8 10*3/uL (ref 0.7–3.1)
MCH: 31.6 pg (ref 26.6–33.0)
MCHC: 34.6 g/dL (ref 31.5–35.7)
MCV: 91 fL (ref 79–97)
MONOCYTES: 9 %
Monocytes Absolute: 0.4 10*3/uL (ref 0.1–0.9)
NEUTROS ABS: 2.9 10*3/uL (ref 1.4–7.0)
Neutrophils: 69 %
POTASSIUM: 5.2 mmol/L (ref 3.5–5.2)
Phosphorus: 3.5 mg/dL (ref 2.5–4.5)
Platelets: 243 10*3/uL (ref 150–379)
RBC: 4.43 x10E6/uL (ref 4.14–5.80)
RDW: 13.3 % (ref 12.3–15.4)
SODIUM: 141 mmol/L (ref 134–144)
T3 Uptake Ratio: 31 % (ref 24–39)
T4, Total: 6.7 ug/dL (ref 4.5–12.0)
TRIGLYCERIDES: 82 mg/dL (ref 0–149)
TSH: 1.29 u[IU]/mL (ref 0.450–4.500)
Total Protein: 6.6 g/dL (ref 6.0–8.5)
URIC ACID: 7.2 mg/dL (ref 3.7–8.6)
VLDL Cholesterol Cal: 16 mg/dL (ref 5–40)
WBC: 4.1 10*3/uL (ref 3.4–10.8)

## 2016-07-04 LAB — MICROALBUMIN / CREATININE URINE RATIO: Creatinine, Urine: 134.8 mg/dL

## 2016-07-10 ENCOUNTER — Encounter: Payer: Managed Care, Other (non HMO) | Admitting: Unknown Physician Specialty

## 2016-07-11 ENCOUNTER — Ambulatory Visit (INDEPENDENT_AMBULATORY_CARE_PROVIDER_SITE_OTHER): Payer: Managed Care, Other (non HMO) | Admitting: Unknown Physician Specialty

## 2016-07-11 ENCOUNTER — Encounter: Payer: Self-pay | Admitting: Unknown Physician Specialty

## 2016-07-11 VITALS — BP 138/82 | HR 58 | Temp 98.1°F | Ht 65.3 in | Wt 150.1 lb

## 2016-07-11 DIAGNOSIS — I10 Essential (primary) hypertension: Secondary | ICD-10-CM | POA: Diagnosis not present

## 2016-07-11 DIAGNOSIS — E7800 Pure hypercholesterolemia, unspecified: Secondary | ICD-10-CM | POA: Diagnosis not present

## 2016-07-11 DIAGNOSIS — Z7189 Other specified counseling: Secondary | ICD-10-CM | POA: Diagnosis not present

## 2016-07-11 DIAGNOSIS — Z0000 Encounter for general adult medical examination without abnormal findings: Secondary | ICD-10-CM | POA: Diagnosis not present

## 2016-07-11 MED ORDER — PRAVASTATIN SODIUM 40 MG PO TABS: 40.0000 mg | ORAL_TABLET | Freq: Every day | ORAL | 1 refills | Status: DC

## 2016-07-11 MED ORDER — LISINOPRIL-HYDROCHLOROTHIAZIDE 20-25 MG PO TABS: 1.0000 | ORAL_TABLET | Freq: Every day | ORAL | 1 refills | Status: DC

## 2016-07-11 NOTE — Progress Notes (Signed)
BP 138/82 (BP Location: Left Arm, Patient Position: Sitting, Cuff Size: Large)   Pulse (!) 58   Temp 98.1 F (36.7 C)   Ht 5' 5.3" (1.659 m)   Wt 150 lb 1.6 oz (68.1 kg)   SpO2 98%   BMI 24.75 kg/m    Subjective:    Patient ID: Warren Richardson, male    DOB: 10/22/1955, 61 y.o.   MRN: [id number]  HPI: Warren Richardson is a 61 y.o. male  Chief Complaint  Patient presents with  . Annual Exam   Hypertension Using medications without difficulty Average home BPs   No problems or lightheadedness No chest pain with exertion or shortness of breath No Edema  Hyperlipidemia Using medications without problems: No Muscle aches  Diet compliance: Exercise:  Relevant past medical, surgical, family and social history reviewed and updated as indicated. Interim medical history since our last visit reviewed. Allergies and medications reviewed and updated.  Review of Systems  Constitutional: Negative.   HENT: Negative.   Eyes: Negative.   Respiratory: Negative.   Cardiovascular: Negative.   Gastrointestinal: Negative.   Endocrine: Negative.   Genitourinary: Negative.   Musculoskeletal:       Right elbow pain    Allergic/Immunologic: Negative.   Neurological: Negative.   Hematological: Negative.   Psychiatric/Behavioral: Negative.     Per HPI unless specifically indicated above     Objective:    BP 138/82 (BP Location: Left Arm, Patient Position: Sitting, Cuff Size: Large)   Pulse (!) 58   Temp 98.1 F (36.7 C)   Ht 5' 5.3" (1.659 m)   Wt 150 lb 1.6 oz (68.1 kg)   SpO2 98%   BMI 24.75 kg/m   Wt Readings from Last 3 Encounters:  07/11/16 150 lb 1.6 oz (68.1 kg)  07/03/16 148 lb (67.1 kg)  01/21/16 151 lb 12.8 oz (68.9 kg)    Physical Exam  Constitutional: He is oriented to person, place, and time. He appears well-developed and well-nourished.  HENT:  Head: Normocephalic.  Right Ear: Tympanic membrane, external ear and ear canal normal.  Left Ear:  Tympanic membrane, external ear and ear canal normal.  Mouth/Throat: Uvula is midline, oropharynx is clear and moist and mucous membranes are normal.  Eyes: Pupils are equal, round, and reactive to light.  Cardiovascular: Normal rate, regular rhythm and normal heart sounds.  Exam reveals no gallop and no friction rub.   No murmur heard. Pulmonary/Chest: Effort normal and breath sounds normal. No respiratory distress.  Abdominal: Soft. Bowel sounds are normal. He exhibits no distension. There is no tenderness.  Genitourinary: Rectum normal and prostate normal.  Musculoskeletal: Normal range of motion.  Neurological: He is alert and oriented to person, place, and time. He has normal reflexes.  Skin: Skin is warm and dry.  Psychiatric: He has a normal mood and affect. His behavior is normal. Judgment and thought content normal.      Labs done through work Assessment & Plan:   Problem List Items Addressed This Visit      Unprioritized   Advanced care planning/counseling discussion    A voluntary discussion about advance care planning including the explanation and discussion of advance directives was extensively discussed  with the patient.  Explanation about the health care proxy and Living will was reviewed and packet with forms with explanation of how to fill them out was given. The patient plans to look over the documents.  He is not able to id a health  care proxy at this time but will think about it.  Patient was offered a separate Advance Care Planning visit for further assistance with forms.        Hyperlipidemia    Stable, continue present medications.        Relevant Medications   lisinopril-hydrochlorothiazide (PRINZIDE,ZESTORETIC) 20-25 MG tablet   pravastatin (PRAVACHOL) 40 MG tablet   Hypertension    .Stable, continue present medications.        Relevant Medications   lisinopril-hydrochlorothiazide (PRINZIDE,ZESTORETIC) 20-25 MG tablet   pravastatin (PRAVACHOL) 40 MG  tablet    Other Visit Diagnoses    Annual physical exam    -  Primary       Follow up plan: Return in about 6 months (around 01/11/2017).

## 2016-07-11 NOTE — Assessment & Plan Note (Signed)
Stable, continue present medications.   

## 2016-07-11 NOTE — Assessment & Plan Note (Signed)
A voluntary discussion about advance care planning including the explanation and discussion of advance directives was extensively discussed  with the patient.  Explanation about the health care proxy and Living will was reviewed and packet with forms with explanation of how to fill them out was given. The patient plans to look over the documents.  He is not able to id a health care proxy at this time but will think about it.  Patient was offered a separate Advance Care Planning visit for further assistance with forms.

## 2016-12-27 ENCOUNTER — Telehealth: Payer: Self-pay | Admitting: Unknown Physician Specialty

## 2016-12-27 DIAGNOSIS — E7800 Pure hypercholesterolemia, unspecified: Secondary | ICD-10-CM

## 2016-12-27 DIAGNOSIS — I10 Essential (primary) hypertension: Secondary | ICD-10-CM

## 2016-12-27 NOTE — Telephone Encounter (Signed)
Shouldn't patient wait until his appointment for labs?

## 2016-12-27 NOTE — Telephone Encounter (Signed)
yes

## 2016-12-27 NOTE — Telephone Encounter (Signed)
Called and spoke with patient. I let him know what Elnita Maxwell said and he stated that last time, Elnita Maxwell just entered orders and he had them [facility] the [facility]. We would like to have this done again.

## 2016-12-27 NOTE — Telephone Encounter (Signed)
Patient called to see if he has labs in for the upcoming OV.  Please advise.  Thank you  [phone]

## 2016-12-28 NOTE — Telephone Encounter (Signed)
Called and let patient know that orders were entered for him. He states he will get them done at the [facility]

## 2017-01-10 ENCOUNTER — Other Ambulatory Visit: Payer: Self-pay

## 2017-01-10 DIAGNOSIS — Z299 Encounter for prophylactic measures, unspecified: Secondary | ICD-10-CM

## 2017-01-10 NOTE — Progress Notes (Signed)
Patient came in to have blood drawn [facility]

## 2017-01-11 LAB — CMP12+LP+TP+TSH+6AC+CBC/D/PLT
ALK PHOS: 61 IU/L (ref 39–117)
ALT: 12 IU/L (ref 0–44)
AST: 36 IU/L (ref 0–40)
Albumin/Globulin Ratio: 2 (ref 1.2–2.2)
Albumin: 4.7 g/dL (ref 3.6–4.8)
BASOS ABS: 0 10*3/uL (ref 0.0–0.2)
BUN/Creatinine Ratio: 16 (ref 10–24)
BUN: 18 mg/dL (ref 8–27)
Basos: 0 %
Bilirubin Total: 0.6 mg/dL (ref 0.0–1.2)
CALCIUM: 9.5 mg/dL (ref 8.6–10.2)
CHLORIDE: 91 mmol/L — AB (ref 96–106)
CHOLESTEROL TOTAL: 211 mg/dL — AB (ref 100–199)
CREATININE: 1.1 mg/dL (ref 0.76–1.27)
Chol/HDL Ratio: 3.1 ratio (ref 0.0–5.0)
EOS (ABSOLUTE): 0 10*3/uL (ref 0.0–0.4)
Eos: 1 %
FREE THYROXINE INDEX: 2.2 (ref 1.2–4.9)
GFR calc Af Amer: 83 mL/min/{1.73_m2} (ref >59)
GFR, EST NON AFRICAN AMERICAN: 72 mL/min/{1.73_m2} (ref >59)
GGT: 18 IU/L (ref 0–65)
GLOBULIN, TOTAL: 2.3 g/dL (ref 1.5–4.5)
Glucose: 85 mg/dL (ref 65–99)
HDL: 69 mg/dL (ref >39)
Hematocrit: 41.7 % (ref 37.5–51.0)
Hemoglobin: 14.1 g/dL (ref 13.0–17.7)
IMMATURE GRANULOCYTES: 0 %
IRON: 144 ug/dL (ref 38–169)
Immature Grans (Abs): 0 10*3/uL (ref 0.0–0.1)
LDH: 170 IU/L (ref 121–224)
LDL Calculated: 126 mg/dL — ABNORMAL HIGH (ref 0–99)
LYMPHS ABS: 0.9 10*3/uL (ref 0.7–3.1)
Lymphs: 22 %
MCH: 32.3 pg (ref 26.6–33.0)
MCHC: 33.8 g/dL (ref 31.5–35.7)
MCV: 95 fL (ref 79–97)
MONOS ABS: 0.4 10*3/uL (ref 0.1–0.9)
Monocytes: 10 %
NEUTROS PCT: 67 %
Neutrophils Absolute: 2.8 10*3/uL (ref 1.4–7.0)
PHOSPHORUS: 3.5 mg/dL (ref 2.5–4.5)
PLATELETS: 217 10*3/uL (ref 150–379)
Potassium: 4.6 mmol/L (ref 3.5–5.2)
RBC: 4.37 x10E6/uL (ref 4.14–5.80)
RDW: 14.1 % (ref 12.3–15.4)
Sodium: 134 mmol/L (ref 134–144)
T3 UPTAKE RATIO: 32 % (ref 24–39)
T4 TOTAL: 7 ug/dL (ref 4.5–12.0)
TSH: 1.59 u[IU]/mL (ref 0.450–4.500)
Total Protein: 7 g/dL (ref 6.0–8.5)
Triglycerides: 78 mg/dL (ref 0–149)
URIC ACID: 7.2 mg/dL (ref 3.7–8.6)
VLDL CHOLESTEROL CAL: 16 mg/dL (ref 5–40)
WBC: 4.2 10*3/uL (ref 3.4–10.8)

## 2017-01-15 ENCOUNTER — Ambulatory Visit (INDEPENDENT_AMBULATORY_CARE_PROVIDER_SITE_OTHER): Payer: Managed Care, Other (non HMO) | Admitting: Unknown Physician Specialty

## 2017-01-15 ENCOUNTER — Encounter: Payer: Self-pay | Admitting: Unknown Physician Specialty

## 2017-01-15 VITALS — BP 159/89 | HR 82 | Temp 98.0°F | Ht 66.7 in | Wt 152.9 lb

## 2017-01-15 DIAGNOSIS — I10 Essential (primary) hypertension: Secondary | ICD-10-CM

## 2017-01-15 DIAGNOSIS — E7800 Pure hypercholesterolemia, unspecified: Secondary | ICD-10-CM

## 2017-01-15 MED ORDER — LISINOPRIL-HYDROCHLOROTHIAZIDE 20-25 MG PO TABS: 1.0000 | ORAL_TABLET | Freq: Every day | ORAL | 1 refills | Status: DC

## 2017-01-15 MED ORDER — PRAVASTATIN SODIUM 40 MG PO TABS: 40.0000 mg | ORAL_TABLET | Freq: Every day | ORAL | 1 refills | Status: DC

## 2017-01-15 NOTE — Progress Notes (Signed)
BP (!) 159/89 (BP Location: Left Arm, Cuff Size: Normal)   Pulse 82   Temp 98 F (36.7 C)   Ht 5' 6.7" (1.694 m)   Wt 152 lb 14.4 oz (69.4 kg)   SpO2 98%   BMI 24.16 kg/m    Subjective:    Patient ID: Warren Richardson, male    DOB: February 10, 1956, 62 y.o.   MRN: [id number]  HPI: Warren Richardson is a 61 y.o. male  Chief Complaint  Patient presents with  . Hyperlipidemia  . Hypertension   Hypertension Using medications without difficulty Average home BPs checks daily.  This morning it was 100/68   No problems or lightheadedness No chest pain with exertion or shortness of breath No Edema  Hyperlipidemia Last LDL was 126.  Pt had tried a  Number of statins including Atorvastatin with liver enzyme bumps Using medications without problems: No Muscle aches  Diet compliance:Exercise: Exercises with cardio and weights.  Diet not as good this summer   Relevant past medical, surgical, family and social history reviewed and updated as indicated. Interim medical history since our last visit reviewed. Allergies and medications reviewed and updated.  Review of Systems  Per HPI unless specifically indicated above     Objective:    BP (!) 159/89 (BP Location: Left Arm, Cuff Size: Normal)   Pulse 82   Temp 98 F (36.7 C)   Ht 5' 6.7" (1.694 m)   Wt 152 lb 14.4 oz (69.4 kg)   SpO2 98%   BMI 24.16 kg/m   Wt Readings from Last 3 Encounters:  01/15/17 152 lb 14.4 oz (69.4 kg)  07/11/16 150 lb 1.6 oz (68.1 kg)  07/03/16 148 lb (67.1 kg)    Physical Exam  Constitutional: He is oriented to person, place, and time. He appears well-developed and well-nourished. No distress.  HENT:  Head: Normocephalic and atraumatic.  Eyes: Conjunctivae and lids are normal. Right eye exhibits no discharge. Left eye exhibits no discharge. No scleral icterus.  Neck: Normal range of motion. Neck supple. No JVD present. Carotid bruit is not present.  Cardiovascular: Normal rate, regular rhythm  and normal heart sounds.   Pulmonary/Chest: Effort normal and breath sounds normal. No respiratory distress.  Abdominal: Normal appearance. There is no splenomegaly or hepatomegaly.  Musculoskeletal: Normal range of motion.  Neurological: He is alert and oriented to person, place, and time.  Skin: Skin is warm, dry and intact. No rash noted. No pallor.  Psychiatric: He has a normal mood and affect. His behavior is normal. Judgment and thought content normal.    Results for orders placed or performed in visit on 01/10/17  Executive Panel  Result Value Ref Range   Glucose 85 65 - 99 mg/dL   Uric Acid 7.2 3.7 - 8.6 mg/dL   BUN 18 8 - 27 mg/dL   Creatinine, Ser 4.09 0.76 - 1.27 mg/dL   GFR calc non Af Amer 72 >59 mL/min/1.73   GFR calc Af Amer 83 >59 mL/min/1.73   BUN/Creatinine Ratio 16 10 - 24   Sodium 134 134 - 144 mmol/L   Potassium 4.6 3.5 - 5.2 mmol/L   Chloride 91 (L) 96 - 106 mmol/L   Calcium 9.5 8.6 - 10.2 mg/dL   Phosphorus 3.5 2.5 - 4.5 mg/dL   Total Protein 7.0 6.0 - 8.5 g/dL   Albumin 4.7 3.6 - 4.8 g/dL   Globulin, Total 2.3 1.5 - 4.5 g/dL   Albumin/Globulin Ratio 2.0 1.2 - 2.2  Bilirubin Total 0.6 0.0 - 1.2 mg/dL   Alkaline Phosphatase 61 39 - 117 IU/L   LDH 170 121 - 224 IU/L   AST 36 0 - 40 IU/L   ALT 12 0 - 44 IU/L   GGT 18 0 - 65 IU/L   Iron 144 38 - 169 ug/dL   Cholesterol, Total 161 (H) 100 - 199 mg/dL   Triglycerides 78 0 - 149 mg/dL   HDL 69 >09 mg/dL   VLDL Cholesterol Cal 16 5 - 40 mg/dL   LDL Calculated 604 (H) 0 - 99 mg/dL   Chol/HDL Ratio 3.1 0.0 - 5.0 ratio   Estimated CHD Risk  < 0.5 0.0 - 1.0 times avg.   TSH 1.590 0.450 - 4.500 uIU/mL   T4, Total 7.0 4.5 - 12.0 ug/dL   T3 Uptake Ratio 32 24 - 39 %   Free Thyroxine Index 2.2 1.2 - 4.9   WBC 4.2 3.4 - 10.8 x10E3/uL   RBC 4.37 4.14 - 5.80 x10E6/uL   Hemoglobin 14.1 13.0 - 17.7 g/dL   Hematocrit 54.0 98.1 - 51.0 %   MCV 95 79 - 97 fL   MCH 32.3 26.6 - 33.0 pg   MCHC 33.8 31.5 - 35.7 g/dL    RDW 19.1 47.8 - 29.5 %   Platelets 217 150 - 379 x10E3/uL   Neutrophils 67 Not Estab. %   Lymphs 22 Not Estab. %   Monocytes 10 Not Estab. %   Eos 1 Not Estab. %   Basos 0 Not Estab. %   Neutrophils Absolute 2.8 1.4 - 7.0 x10E3/uL   Lymphocytes Absolute 0.9 0.7 - 3.1 x10E3/uL   Monocytes Absolute 0.4 0.1 - 0.9 x10E3/uL   EOS (ABSOLUTE) 0.0 0.0 - 0.4 x10E3/uL   Basophils Absolute 0.0 0.0 - 0.2 x10E3/uL   Immature Granulocytes 0 Not Estab. %   Immature Grans (Abs) 0.0 0.0 - 0.1 x10E3/uL      Assessment & Plan:   Problem List Items Addressed This Visit      Unprioritized   Hyperlipidemia   Relevant Medications   lisinopril-hydrochlorothiazide (PRINZIDE,ZESTORETIC) 20-25 MG tablet   pravastatin (PRAVACHOL) 40 MG tablet   Other Relevant Orders   Lipid Panel Piccolo, Waived   PSA   CBC with Differential/Platelet   Hypertension - Primary   Relevant Medications   lisinopril-hydrochlorothiazide (PRINZIDE,ZESTORETIC) 20-25 MG tablet   pravastatin (PRAVACHOL) 40 MG tablet   Other Relevant Orders   Comprehensive metabolic panel   Uric acid   PSA   CBC with Differential/Platelet       Follow up plan: Return in about 6 months (around 07/15/2017) for physical.

## 2017-02-01 ENCOUNTER — Other Ambulatory Visit: Payer: Self-pay | Admitting: Unknown Physician Specialty

## 2017-05-18 ENCOUNTER — Ambulatory Visit
Admission: RE | Admit: 2017-05-18 | Discharge: 2017-05-18 | Disposition: A | Discharge status: Home / Self Care | Payer: 59 | Source: Ambulatory Visit | Attending: Medical | Admitting: Medical

## 2017-05-18 ENCOUNTER — Ambulatory Visit: Payer: Self-pay | Admitting: Medical

## 2017-05-18 VITALS — BP 121/79 | HR 84 | Temp 97.8°F | Resp 16

## 2017-05-18 DIAGNOSIS — M7989 Other specified soft tissue disorders: Secondary | ICD-10-CM | POA: Diagnosis not present

## 2017-05-18 DIAGNOSIS — M25522 Pain in left elbow: Secondary | ICD-10-CM | POA: Diagnosis present

## 2017-05-18 DIAGNOSIS — M7022 Olecranon bursitis, left elbow: Secondary | ICD-10-CM

## 2017-05-18 MED ORDER — CLINDAMYCIN HCL 300 MG PO CAPS: 300.0000 mg | ORAL_CAPSULE | Freq: Four times a day (QID) | ORAL | 0 refills | Status: DC

## 2017-05-18 NOTE — Progress Notes (Signed)
Subjective:    Patient ID: DEMORIAN CANCHOLA, male    DOB: Oct 26, 1955, 62 y.o.   MRN: [id number]  HPI 62 yo male in non acute distress. Noted swelling left elbow yesterday about 5:30pm. Denies fever or chills, numbness or tingling. Has noted pain 3rd distal metacarpal area.  Hit elbow last week on "something" not sure what just remembers it hurt when he did it.  Review of Systems  Constitutional: Negative for chills and fever.  Musculoskeletal: Positive for joint swelling (left elbow).  no other complaints.     Objective:   Physical Exam NAD rrr , lungs CTAB Swelling posterior left elbow with mild redness. Positive for warmth. Full range of motion. Good grip, able to supinate. And flex and extend elbow.  Full range of motion of 3rd finger , able to flex and extend, nontender to palp at metacarpal or hand.. No erythema in hand all fingers FROM.      Assessment & Plan:  Left Elbow pain most likely Bursitis, sent for xray stat. Return on Monday for recheck 10 am. Seek out medical care if not improving in 24-48 hours at an urgent care or the Emergency Department. Will call patient with results.  x-ray no fracture consistant with olecranon bursitis. Discussed with Dr. Sullivan Lone treat, reviewed antibiotic choice and will also do  Orthopedic referral by [facility]. Meds ordered this encounter  Medications  . clindamycin (CLEOCIN) 300 MG capsule    Sig: Take 1 capsule (300 mg total) by mouth 4 (four) times daily.    Dispense:  28 capsule    Refill:  0

## 2017-05-18 NOTE — Progress Notes (Signed)
I spoke with the patient about his xray results and he expressed understanding.

## 2017-05-18 NOTE — Patient Instructions (Signed)
Bursitis Bursitis is when the fluid-filled sac (bursa) that covers and protects a joint is swollen (inflamed). Bursitis is most common near joints, especially the knees, elbows, hips, and shoulders. Follow these instructions at home:  Take medicines only as told by your doctor.  If you were prescribed an antibiotic medicine, finish it all even if you start to feel better.  Rest the affected area as told by your doctor. ? Keep the area raised up. ? Avoid doing things that make the pain worse.  Apply ice to the injured area: ? Place ice in a plastic bag. ? Place a towel between your skin and the bag. ? Leave the ice on for 20 minutes, 2-3 times a day.  Use splints, braces, pads, or walking aids as told by your doctor.  Keep all follow-up visits as told by your doctor. This is important. Contact a doctor if:  You have more pain with home care.  You have a fever.  You have chills. This information is not intended to replace advice given to you by your health care provider. Make sure you discuss any questions you have with your health care provider. Document Released: 10/12/2009 Document Revised: 09/30/2015 Document Reviewed: 07/14/2013 Elsevier Interactive Patient Education  2018 [employer] Inc[employer]  

## 2017-05-21 ENCOUNTER — Ambulatory Visit: Payer: Self-pay | Admitting: Physician Assistant

## 2017-05-21 ENCOUNTER — Encounter: Payer: Self-pay | Admitting: Physician Assistant

## 2017-05-21 VITALS — BP 121/75 | HR 67 | Temp 98.5°F | Resp 16

## 2017-05-21 DIAGNOSIS — M7022 Olecranon bursitis, left elbow: Secondary | ICD-10-CM

## 2017-05-21 NOTE — Progress Notes (Signed)
Subjective: Olecranon bursitis     Patient ID: Warren Richardson, male    DOB: 1956-04-06, 62 y.o.   MRN: [id number]  HPI  Patient follow-up for left elbow edema onset 3 days ago. Patient states since last visit there is been no change in size but decreased pain and increase of range of motion with extension of the left upper extremity. Patient denies loss of sensation.   Review of Systems Hypertension and hyperlipidemia    Objective:   Physical Exam Left posterior elbow edema. Decreased range of motion with extension.       Assessment & Plan: Left olecranon bursitis   Patient will be consult to orthopedic for aspiration of lesion.

## 2017-07-10 ENCOUNTER — Other Ambulatory Visit: Payer: Self-pay

## 2017-07-10 DIAGNOSIS — Z299 Encounter for prophylactic measures, unspecified: Secondary | ICD-10-CM

## 2017-07-10 DIAGNOSIS — E785 Hyperlipidemia, unspecified: Secondary | ICD-10-CM

## 2017-07-11 LAB — COMPREHENSIVE METABOLIC PANEL
ALK PHOS: 63 IU/L (ref 39–117)
ALT: 16 IU/L (ref 0–44)
AST: 41 IU/L — AB (ref 0–40)
Albumin/Globulin Ratio: 2.1 (ref 1.2–2.2)
Albumin: 4.8 g/dL (ref 3.6–4.8)
BUN/Creatinine Ratio: 15 (ref 10–24)
BUN: 18 mg/dL (ref 8–27)
Bilirubin Total: 0.6 mg/dL (ref 0.0–1.2)
CALCIUM: 9.9 mg/dL (ref 8.6–10.2)
CO2: 28 mmol/L (ref 20–29)
CREATININE: 1.17 mg/dL (ref 0.76–1.27)
Chloride: 96 mmol/L (ref 96–106)
GFR calc Af Amer: 77 mL/min/{1.73_m2} (ref >59)
GFR calc non Af Amer: 67 mL/min/{1.73_m2} (ref >59)
GLOBULIN, TOTAL: 2.3 g/dL (ref 1.5–4.5)
GLUCOSE: 112 mg/dL — AB (ref 65–99)
Potassium: 5.1 mmol/L (ref 3.5–5.2)
SODIUM: 139 mmol/L (ref 134–144)
Total Protein: 7.1 g/dL (ref 6.0–8.5)

## 2017-07-11 LAB — CBC WITH DIFFERENTIAL/PLATELET
Basophils Absolute: 0 10*3/uL (ref 0.0–0.2)
Basos: 0 %
EOS (ABSOLUTE): 0.1 10*3/uL (ref 0.0–0.4)
Eos: 2 %
HEMATOCRIT: 41.5 % (ref 37.5–51.0)
Hemoglobin: 14.7 g/dL (ref 13.0–17.7)
Immature Grans (Abs): 0 10*3/uL (ref 0.0–0.1)
Immature Granulocytes: 0 %
LYMPHS ABS: 0.8 10*3/uL (ref 0.7–3.1)
Lymphs: 23 %
MCH: 33.1 pg — ABNORMAL HIGH (ref 26.6–33.0)
MCHC: 35.4 g/dL (ref 31.5–35.7)
MCV: 94 fL (ref 79–97)
MONOS ABS: 0.4 10*3/uL (ref 0.1–0.9)
Monocytes: 12 %
Neutrophils Absolute: 2.2 10*3/uL (ref 1.4–7.0)
Neutrophils: 63 %
PLATELETS: 208 10*3/uL (ref 150–379)
RBC: 4.44 x10E6/uL (ref 4.14–5.80)
RDW: 13.6 % (ref 12.3–15.4)
WBC: 3.6 10*3/uL (ref 3.4–10.8)

## 2017-07-11 LAB — LIPID PANEL
Chol/HDL Ratio: 3.2 ratio (ref 0.0–5.0)
Cholesterol, Total: 241 mg/dL — ABNORMAL HIGH (ref 100–199)
HDL: 76 mg/dL (ref >39)
LDL Calculated: 131 mg/dL — ABNORMAL HIGH (ref 0–99)
Triglycerides: 172 mg/dL — ABNORMAL HIGH (ref 0–149)
VLDL CHOLESTEROL CAL: 34 mg/dL (ref 5–40)

## 2017-07-11 LAB — PSA: PROSTATE SPECIFIC AG, SERUM: 3.5 ng/mL (ref 0.0–4.0)

## 2017-07-11 LAB — URIC ACID: Uric Acid: 6.6 mg/dL (ref 3.7–8.6)

## 2017-07-16 ENCOUNTER — Encounter: Payer: Managed Care, Other (non HMO) | Admitting: Unknown Physician Specialty

## 2017-07-27 ENCOUNTER — Encounter: Payer: Self-pay | Admitting: Unknown Physician Specialty

## 2017-07-27 ENCOUNTER — Ambulatory Visit (INDEPENDENT_AMBULATORY_CARE_PROVIDER_SITE_OTHER): Payer: Managed Care, Other (non HMO) | Admitting: Unknown Physician Specialty

## 2017-07-27 VITALS — BP 137/80 | HR 80 | Temp 98.5°F | Ht 65.7 in | Wt 153.4 lb

## 2017-07-27 DIAGNOSIS — Z8042 Family history of malignant neoplasm of prostate: Secondary | ICD-10-CM

## 2017-07-27 DIAGNOSIS — I10 Essential (primary) hypertension: Secondary | ICD-10-CM | POA: Diagnosis not present

## 2017-07-27 DIAGNOSIS — E785 Hyperlipidemia, unspecified: Secondary | ICD-10-CM

## 2017-07-27 DIAGNOSIS — Z0000 Encounter for general adult medical examination without abnormal findings: Secondary | ICD-10-CM

## 2017-07-27 DIAGNOSIS — L821 Other seborrheic keratosis: Secondary | ICD-10-CM | POA: Insufficient documentation

## 2017-07-27 MED ORDER — LISINOPRIL-HYDROCHLOROTHIAZIDE 20-25 MG PO TABS: 1.0000 | ORAL_TABLET | Freq: Every day | ORAL | 1 refills | Status: DC

## 2017-07-27 MED ORDER — PRAVASTATIN SODIUM 40 MG PO TABS
40.0000 mg | ORAL_TABLET | Freq: Every day | ORAL | 1 refills | Status: DC
Start: 2017-07-27 — End: 2018-02-08

## 2017-07-27 NOTE — Progress Notes (Signed)
BP 137/80   Pulse 80   Temp 98.5 F (36.9 C) (Oral)   Ht 5' 5.7" (1.669 m)   Wt 153 lb 6.4 oz (69.6 kg)   SpO2 97%   BMI 24.99 kg/m    Subjective:    Patient ID: Warren Richardson, male    DOB: April 02, 1956, 62 y.o.   MRN: [id number]  HPI: Warren Richardson is a 62 y.o. male  Chief Complaint  Patient presents with  . Annual Exam    Hypertension Using medications without difficulty Average home BPs consistent with what it was here.     No problems or lightheadedness No chest pain with exertion or shortness of breath No Edema  Hyperlipidemia Using medications without problems No Muscle aches  Diet compliance:Exercise: States he tries to eat well and exercises 6-7 days/week  Skin lesion on top pf head Has been there for years.  No real change   Social History   Socioeconomic History  . Marital status: Single    Spouse name: Not on file  . Number of children: Not on file  . Years of education: Not on file  . Highest education level: Not on file  Occupational History  . Not on file  Social Needs  . Financial resource strain: Not on file  . Food insecurity:    Worry: Not on file    Inability: Not on file  . Transportation needs:    Medical: Not on file    Non-medical: Not on file  Tobacco Use  . Smoking status: Never Smoker  . Smokeless tobacco: Never Used  Substance and Sexual Activity  . Alcohol use: Yes    Alcohol/week: 0.6 - 1.2 oz    Types: 1 - 2 Cans of beer per week  . Drug use: No  . Sexual activity: Yes  Lifestyle  . Physical activity:    Days per week: Not on file    Minutes per session: Not on file  . Stress: Not on file  Relationships  . Social connections:    Talks on phone: Not on file    Gets together: Not on file    Attends religious service: Not on file    Active member of club or organization: Not on file    Attends meetings of clubs or organizations: Not on file    Relationship status: Not on file  . Intimate partner  violence:    Fear of current or ex partner: Not on file    Emotionally abused: Not on file    Physically abused: Not on file    Forced sexual activity: Not on file  Other Topics Concern  . Not on file  Social History Narrative  . Not on file   Family History  Problem Relation Age of Onset  . Arthritis Mother   . Stroke Mother   . Dementia Mother   . Cancer Father        prostate  . Diabetes Father   . Hyperlipidemia Father   . Hypertension Father   . Cancer Sister        breast   Past Medical History:  Diagnosis Date  . Hyperlipidemia   . Hypertension    History reviewed. No pertinent surgical history.  Relevant past medical, surgical, family and social history reviewed and updated as indicated. Interim medical history since our last visit reviewed. Allergies and medications reviewed and updated.  Review of Systems  Per HPI unless specifically indicated above  Objective:    BP 137/80   Pulse 80   Temp 98.5 F (36.9 C) (Oral)   Ht 5' 5.7" (1.669 m)   Wt 153 lb 6.4 oz (69.6 kg)   SpO2 97%   BMI 24.99 kg/m   Wt Readings from Last 3 Encounters:  07/27/17 153 lb 6.4 oz (69.6 kg)  01/15/17 152 lb 14.4 oz (69.4 kg)  07/11/16 150 lb 1.6 oz (68.1 kg)    Physical Exam  Constitutional: He is oriented to person, place, and time. He appears well-developed and well-nourished.  HENT:  Head: Normocephalic.  Right Ear: Tympanic membrane, external ear and ear canal normal.  Left Ear: Tympanic membrane, external ear and ear canal normal.  Mouth/Throat: Uvula is midline, oropharynx is clear and moist and mucous membranes are normal.  Eyes: Pupils are equal, round, and reactive to light.  Cardiovascular: Normal rate, regular rhythm and normal heart sounds. Exam reveals no gallop and no friction rub.  No murmur heard. Pulmonary/Chest: Effort normal and breath sounds normal. No respiratory distress.  Abdominal: Soft. Bowel sounds are normal. He exhibits no distension.  There is no tenderness.  Musculoskeletal: Normal range of motion.  Neurological: He is alert and oriented to person, place, and time. He has normal reflexes.  Skin: Skin is warm and dry.  Psychiatric: He has a normal mood and affect. His behavior is normal. Judgment and thought content normal.    Results for orders placed or performed in visit on 07/10/17  CBC with Diff  Result Value Ref Range   WBC 3.6 3.4 - 10.8 x10E3/uL   RBC 4.44 4.14 - 5.80 x10E6/uL   Hemoglobin 14.7 13.0 - 17.7 g/dL   Hematocrit 32.4 40.1 - 51.0 %   MCV 94 79 - 97 fL   MCH 33.1 (H) 26.6 - 33.0 pg   MCHC 35.4 31.5 - 35.7 g/dL   RDW 02.7 25.3 - 66.4 %   Platelets 208 150 - 379 x10E3/uL   Neutrophils 63 Not Estab. %   Lymphs 23 Not Estab. %   Monocytes 12 Not Estab. %   Eos 2 Not Estab. %   Basos 0 Not Estab. %   Neutrophils Absolute 2.2 1.4 - 7.0 x10E3/uL   Lymphocytes Absolute 0.8 0.7 - 3.1 x10E3/uL   Monocytes Absolute 0.4 0.1 - 0.9 x10E3/uL   EOS (ABSOLUTE) 0.1 0.0 - 0.4 x10E3/uL   Basophils Absolute 0.0 0.0 - 0.2 x10E3/uL   Immature Granulocytes 0 Not Estab. %   Immature Grans (Abs) 0.0 0.0 - 0.1 x10E3/uL  Uric acid  Result Value Ref Range   Uric Acid 6.6 3.7 - 8.6 mg/dL  PSA  Result Value Ref Range   Prostate Specific Ag, Serum 3.5 0.0 - 4.0 ng/mL  Lipid Profile  Result Value Ref Range   Cholesterol, Total 241 (H) 100 - 199 mg/dL   Triglycerides 403 (H) 0 - 149 mg/dL   HDL 76 >47 mg/dL   VLDL Cholesterol Cal 34 5 - 40 mg/dL   LDL Calculated 425 (H) 0 - 99 mg/dL   Chol/HDL Ratio 3.2 0.0 - 5.0 ratio  Comp Met (CMET)  Result Value Ref Range   Glucose 112 (H) 65 - 99 mg/dL   BUN 18 8 - 27 mg/dL   Creatinine, Ser 9.56 0.76 - 1.27 mg/dL   GFR calc non Af Amer 67 >59 mL/min/1.73   GFR calc Af Amer 77 >59 mL/min/1.73   BUN/Creatinine Ratio 15 10 - 24   Sodium 139 134 -  144 mmol/L   Potassium 5.1 3.5 - 5.2 mmol/L   Chloride 96 96 - 106 mmol/L   CO2 28 20 - 29 mmol/L   Calcium 9.9 8.6 - 10.2  mg/dL   Total Protein 7.1 6.0 - 8.5 g/dL   Albumin 4.8 3.6 - 4.8 g/dL   Globulin, Total 2.3 1.5 - 4.5 g/dL   Albumin/Globulin Ratio 2.1 1.2 - 2.2   Bilirubin Total 0.6 0.0 - 1.2 mg/dL   Alkaline Phosphatase 63 39 - 117 IU/L   AST 41 (H) 0 - 40 IU/L   ALT 16 0 - 44 IU/L      Assessment & Plan:   Problem List Items Addressed This Visit      Unprioritized   Hyperlipidemia - Primary    Stable, continue present medications.        Relevant Medications   pravastatin (PRAVACHOL) 40 MG tablet   lisinopril-hydrochlorothiazide (PRINZIDE,ZESTORETIC) 20-25 MG tablet   Other Relevant Orders   Lipid Panel w/o Chol/HDL Ratio   Hypertension    Not to goal of below 130/80.  Will work on [employer]      Relevant Medications   pravastatin (PRAVACHOL) 40 MG tablet   lisinopril-hydrochlorothiazide (PRINZIDE,ZESTORETIC) 20-25 MG tablet   Other Relevant Orders   Comprehensive metabolic panel   Seborrheic keratoses    On scalp.  Consider shave biopsy.         Other Visit Diagnoses    Routine general medical examination at a health care facility       Relevant Orders   TSH   Family history of prostate cancer       Relevant Orders   PSA      Colonoscopy due 2020 Td due 2024  Follow up plan: Return in about 6 months (around 01/27/2018) for plus schedule skin lesion removal.

## 2017-07-27 NOTE — Assessment & Plan Note (Signed)
On scalp.  Consider shave biopsy.

## 2017-07-27 NOTE — Assessment & Plan Note (Signed)
Not to goal of below 130/80.  Will work on [employer] diet

## 2017-07-27 NOTE — Patient Instructions (Signed)
DASH Eating Plan DASH stands for "Dietary Approaches to Stop Hypertension." The DASH eating plan is a healthy eating plan that has been shown to reduce high blood pressure (hypertension). It may also reduce your risk for type 2 diabetes, heart disease, and stroke. The DASH eating plan may also help with weight loss. What are tips for following this plan? General guidelines  Avoid eating more than 2,300 mg (milligrams) of salt (sodium) a day. If you have hypertension, you may need to reduce your sodium intake to 1,500 mg a day.  Limit alcohol intake to no more than 1 drink a day for nonpregnant women and 2 drinks a day for men. One drink equals 12 oz of beer, 5 oz of wine, or 1 oz of hard liquor.  Work with your health care provider to maintain a healthy body weight or to lose weight. Ask what an ideal weight is for you.  Get at least 30 minutes of exercise that causes your heart to beat faster (aerobic exercise) most days of the week. Activities may include walking, swimming, or biking.  Work with your health care provider or diet and nutrition specialist (dietitian) to adjust your eating plan to your individual calorie needs. Reading food labels  Check food labels for the amount of sodium per serving. Choose foods with less than 5 percent of the Daily Value of sodium. Generally, foods with less than 300 mg of sodium per serving fit into this eating plan.  To find whole grains, look for the word "whole" as the first word in the ingredient list. Shopping  Buy products labeled as "low-sodium" or "no salt added."  Buy fresh foods. Avoid canned foods and premade or frozen meals. Cooking  Avoid adding salt when cooking. Use salt-free seasonings or herbs instead of table salt or sea salt. Check with your health care provider or pharmacist before using salt substitutes.  Do not fry foods. Cook foods using healthy methods such as baking, boiling, grilling, and broiling instead.  Cook with  heart-healthy oils, such as olive, canola, soybean, or sunflower oil. Meal planning   Eat a balanced diet that includes: ? 5 or more servings of fruits and vegetables each day. At each meal, try to fill half of your plate with fruits and vegetables. ? Up to 6-8 servings of whole grains each day. ? Less than 6 oz of lean meat, poultry, or fish each day. A 3-oz serving of meat is about the same size as a deck of cards. One egg equals 1 oz. ? 2 servings of low-fat dairy each day. ? A serving of nuts, seeds, or beans 5 times each week. ? Heart-healthy fats. Healthy fats called Omega-3 fatty acids are found in foods such as flaxseeds and coldwater fish, like sardines, salmon, and mackerel.  Limit how much you eat of the following: ? Canned or prepackaged foods. ? Food that is high in trans fat, such as fried foods. ? Food that is high in saturated fat, such as fatty meat. ? Sweets, desserts, sugary drinks, and other foods with added sugar. ? Full-fat dairy products.  Do not salt foods before eating.  Try to eat at least 2 vegetarian meals each week.  Eat more home-cooked food and less restaurant, buffet, and fast food.  When eating at a restaurant, ask that your food be prepared with less salt or no salt, if possible. What foods are recommended? The items listed may not be a complete list. Talk with your dietitian about what   dietary choices are best for you. Grains Whole-grain or whole-wheat bread. Whole-grain or whole-wheat pasta. Brown rice. Oatmeal. Quinoa. Bulgur. Whole-grain and low-sodium cereals. Pita bread. Low-fat, low-sodium crackers. Whole-wheat flour tortillas. Vegetables Fresh or frozen vegetables (raw, steamed, roasted, or grilled). Low-sodium or reduced-sodium tomato and vegetable juice. Low-sodium or reduced-sodium tomato sauce and tomato paste. Low-sodium or reduced-sodium canned vegetables. Fruits All fresh, dried, or frozen fruit. Canned fruit in natural juice (without  added sugar). Meat and other protein foods Skinless chicken or turkey. Ground chicken or turkey. Pork with fat trimmed off. Fish and seafood. Egg whites. Dried beans, peas, or lentils. Unsalted nuts, nut butters, and seeds. Unsalted canned beans. Lean cuts of beef with fat trimmed off. Low-sodium, lean deli meat. Dairy Low-fat (1%) or fat-free (skim) milk. Fat-free, low-fat, or reduced-fat cheeses. Nonfat, low-sodium ricotta or cottage cheese. Low-fat or nonfat yogurt. Low-fat, low-sodium cheese. Fats and oils Soft margarine without trans fats. Vegetable oil. Low-fat, reduced-fat, or light mayonnaise and salad dressings (reduced-sodium). Canola, safflower, olive, soybean, and sunflower oils. Avocado. Seasoning and other foods Herbs. Spices. Seasoning mixes without salt. Unsalted popcorn and pretzels. Fat-free sweets. What foods are not recommended? The items listed may not be a complete list. Talk with your dietitian about what dietary choices are best for you. Grains Baked goods made with fat, such as croissants, muffins, or some breads. Dry pasta or rice meal packs. Vegetables Creamed or fried vegetables. Vegetables in a cheese sauce. Regular canned vegetables (not low-sodium or reduced-sodium). Regular canned tomato sauce and paste (not low-sodium or reduced-sodium). Regular tomato and vegetable juice (not low-sodium or reduced-sodium). Pickles. Olives. Fruits Canned fruit in a light or heavy syrup. Fried fruit. Fruit in cream or butter sauce. Meat and other protein foods Fatty cuts of meat. Ribs. Fried meat. Bacon. Sausage. Bologna and other processed lunch meats. Salami. Fatback. Hotdogs. Bratwurst. Salted nuts and seeds. Canned beans with added salt. Canned or smoked fish. Whole eggs or egg yolks. Chicken or turkey with skin. Dairy Whole or 2% milk, cream, and half-and-half. Whole or full-fat cream cheese. Whole-fat or sweetened yogurt. Full-fat cheese. Nondairy creamers. Whipped toppings.  Processed cheese and cheese spreads. Fats and oils Butter. Stick margarine. Lard. Shortening. Ghee. Bacon fat. Tropical oils, such as coconut, palm kernel, or palm oil. Seasoning and other foods Salted popcorn and pretzels. Onion salt, garlic salt, seasoned salt, table salt, and sea salt. Worcestershire sauce. Tartar sauce. Barbecue sauce. Teriyaki sauce. Soy sauce, including reduced-sodium. Steak sauce. Canned and packaged gravies. Fish sauce. Oyster sauce. Cocktail sauce. Horseradish that you find on the shelf. Ketchup. Mustard. Meat flavorings and tenderizers. Bouillon cubes. Hot sauce and Tabasco sauce. Premade or packaged marinades. Premade or packaged taco seasonings. Relishes. Regular salad dressings. Where to find more information:  National Heart, Lung, and Blood Institute: www.nhlbi.nih.gov  American Heart Association: www.heart.org Summary  The DASH eating plan is a healthy eating plan that has been shown to reduce high blood pressure (hypertension). It may also reduce your risk for type 2 diabetes, heart disease, and stroke.  With the DASH eating plan, you should limit salt (sodium) intake to 2,300 mg a day. If you have hypertension, you may need to reduce your sodium intake to 1,500 mg a day.  When on the DASH eating plan, aim to eat more fresh fruits and vegetables, whole grains, lean proteins, low-fat dairy, and heart-healthy fats.  Work with your health care provider or diet and nutrition specialist (dietitian) to adjust your eating plan to your individual   calorie needs. This information is not intended to replace advice given to you by your health care provider. Make sure you discuss any questions you have with your health care provider. Document Released: 04/13/2011 Document Revised: 04/17/2016 Document Reviewed: 04/17/2016 Elsevier Interactive Patient Education  2018 Elsevier Inc.  

## 2017-07-27 NOTE — Assessment & Plan Note (Signed)
Stable, continue present medications.   

## 2017-12-24 ENCOUNTER — Ambulatory Visit: Payer: Managed Care, Other (non HMO) | Admitting: Physician Assistant

## 2017-12-24 ENCOUNTER — Encounter: Payer: Self-pay | Admitting: Physician Assistant

## 2017-12-24 VITALS — BP 178/88 | HR 85 | Wt 154.0 lb

## 2017-12-24 DIAGNOSIS — M25561 Pain in right knee: Secondary | ICD-10-CM | POA: Diagnosis not present

## 2017-12-24 DIAGNOSIS — I493 Ventricular premature depolarization: Secondary | ICD-10-CM | POA: Insufficient documentation

## 2017-12-24 NOTE — Patient Instructions (Signed)
Fall Prevention in the Home Falls can cause injuries and can affect people from all age groups. There are many simple things that you can do to make your home safe and to help prevent falls. What can I do on the outside of my home?  Regularly repair the edges of walkways and driveways and fix any cracks.  Remove high doorway thresholds.  Trim any shrubbery on the main path into your home.  Use bright outdoor lighting.  Clear walkways of debris and clutter, including tools and rocks.  Regularly check that handrails are securely fastened and in good repair. Both sides of any steps should have handrails.  Install guardrails along the edges of any raised decks or porches.  Have leaves, snow, and ice cleared regularly.  Use sand or salt on walkways during winter months.  In the garage, clean up any spills right away, including grease or oil spills. What can I do in the bathroom?  Use night lights.  Install grab bars by the toilet and in the tub and shower. Do not use towel bars as grab bars.  Use non-skid mats or decals on the floor of the tub or shower.  If you need to sit down while you are in the shower, use a plastic, non-slip stool.  Keep the floor dry. Immediately clean up any water that spills on the floor.  Remove soap buildup in the tub or shower on a regular basis.  Attach bath mats securely with double-sided non-slip rug tape.  Remove throw rugs and other tripping hazards from the floor. What can I do in the bedroom?  Use night lights.  Make sure that a bedside light is easy to reach.  Do not use oversized bedding that drapes onto the floor.  Have a firm chair that has side arms to use for getting dressed.  Remove throw rugs and other tripping hazards from the floor. What can I do in the kitchen?  Clean up any spills right away.  Avoid walking on wet floors.  Place frequently used items in easy-to-reach places.  If you need to reach for something above  you, use a sturdy step stool that has a grab bar.  Keep electrical cables out of the way.  Do not use floor polish or wax that makes floors slippery. If you have to use wax, make sure that it is non-skid floor wax.  Remove throw rugs and other tripping hazards from the floor. What can I do in the stairways?  Do not leave any items on the stairs.  Make sure that there are handrails on both sides of the stairs. Fix handrails that are broken or loose. Make sure that handrails are as long as the stairways.  Check any carpeting to make sure that it is firmly attached to the stairs. Fix any carpet that is loose or worn.  Avoid having throw rugs at the top or bottom of stairways, or secure the rugs with carpet tape to prevent them from moving.  Make sure that you have a light switch at the top of the stairs and the bottom of the stairs. If you do not have them, have them installed. What are some other fall prevention tips?  Wear closed-toe shoes that fit well and support your feet. Wear shoes that have rubber soles or low heels.  When you use a stepladder, make sure that it is completely opened and that the sides are firmly locked. Have someone hold the ladder while you are using   it. Do not climb a closed stepladder.  Add color or contrast paint or tape to grab bars and handrails in your home. Place contrasting color strips on the first and last steps.  Use mobility aids as needed, such as canes, walkers, scooters, and crutches.  Turn on lights if it is dark. Replace any light bulbs that burn out.  Set up furniture so that there are clear paths. Keep the furniture in the same spot.  Fix any uneven floor surfaces.  Choose a carpet design that does not hide the edge of steps of a stairway.  Be aware of any and all pets.  Review your medicines with your healthcare provider. Some medicines can cause dizziness or changes in blood pressure, which increase your risk of falling. Talk with  your health care provider about other ways that you can decrease your risk of falls. This may include working with a physical therapist or trainer to improve your strength, balance, and endurance. This information is not intended to replace advice given to you by your health care provider. Make sure you discuss any questions you have with your health care provider. Document Released: 04/14/2002 Document Revised: 09/21/2015 Document Reviewed: 05/29/2014 Elsevier Interactive Patient Education  2018 Elsevier Inc.  

## 2017-12-24 NOTE — Progress Notes (Signed)
Subjective:    Patient ID: Warren Richardson, male    DOB: 12-Dec-1955, 62 y.o.   MRN: [id number]  Warren Richardson is a 62 y.o. male presenting on 12/24/2017 for Swelling and Sleep walking   HPI  Larey Seat the other day while sleep walking and hit stairs. Back of right knee swollen and right ankle swollen and bruised. He reports being able to stand up and walk after fall with minimal pain. Wants to know if he can continue working out.  Social History   Tobacco Use  . Smoking status: Never Smoker  . Smokeless tobacco: Never Used  Substance Use Topics  . Alcohol use: Yes    Alcohol/week: 1.0 - 2.0 standard drinks    Types: 1 - 2 Cans of beer per week  . Drug use: No    Review of Systems Per HPI unless specifically indicated above     Objective:    BP (!) 178/88   Pulse 85   Wt 154 lb (69.9 kg)   SpO2 98%   BMI 25.08 kg/m   Wt Readings from Last 3 Encounters:  12/24/17 154 lb (69.9 kg)  07/27/17 153 lb 6.4 oz (69.6 kg)  01/15/17 152 lb 14.4 oz (69.4 kg)    Physical Exam  Constitutional: He is oriented to person, place, and time.  Musculoskeletal: Normal range of motion. He exhibits edema. He exhibits no tenderness or deformity.  Some edema in posterior right knee. Right ankle slightly ecchymotic but not tender or edematous. Full ROM in bilateral lower extremities.   Neurological: He is alert and oriented to person, place, and time.  Skin: Skin is warm and dry.  Psychiatric: He has a normal mood and affect. His behavior is normal.   Results for orders placed or performed in visit on 07/10/17  CBC with Diff  Result Value Ref Range   WBC 3.6 3.4 - 10.8 x10E3/uL   RBC 4.44 4.14 - 5.80 x10E6/uL   Hemoglobin 14.7 13.0 - 17.7 g/dL   Hematocrit 40.9 81.1 - 51.0 %   MCV 94 79 - 97 fL   MCH 33.1 (H) 26.6 - 33.0 pg   MCHC 35.4 31.5 - 35.7 g/dL   RDW 91.4 78.2 - 95.6 %   Platelets 208 150 - 379 x10E3/uL   Neutrophils 63 Not Estab. %   Lymphs 23 Not Estab. %   Monocytes  12 Not Estab. %   Eos 2 Not Estab. %   Basos 0 Not Estab. %   Neutrophils Absolute 2.2 1.4 - 7.0 x10E3/uL   Lymphocytes Absolute 0.8 0.7 - 3.1 x10E3/uL   Monocytes Absolute 0.4 0.1 - 0.9 x10E3/uL   EOS (ABSOLUTE) 0.1 0.0 - 0.4 x10E3/uL   Basophils Absolute 0.0 0.0 - 0.2 x10E3/uL   Immature Granulocytes 0 Not Estab. %   Immature Grans (Abs) 0.0 0.0 - 0.1 x10E3/uL  Uric acid  Result Value Ref Range   Uric Acid 6.6 3.7 - 8.6 mg/dL  PSA  Result Value Ref Range   Prostate Specific Ag, Serum 3.5 0.0 - 4.0 ng/mL  Lipid Profile  Result Value Ref Range   Cholesterol, Total 241 (H) 100 - 199 mg/dL   Triglycerides 213 (H) 0 - 149 mg/dL   HDL 76 >08 mg/dL   VLDL Cholesterol Cal 34 5 - 40 mg/dL   LDL Calculated 657 (H) 0 - 99 mg/dL   Chol/HDL Ratio 3.2 0.0 - 5.0 ratio  Comp Met (CMET)  Result Value Ref Range  Glucose 112 (H) 65 - 99 mg/dL   BUN 18 8 - 27 mg/dL   Creatinine, Ser 5.64 0.76 - 1.27 mg/dL   GFR calc non Af Amer 67 >59 mL/min/1.73   GFR calc Af Amer 77 >59 mL/min/1.73   BUN/Creatinine Ratio 15 10 - 24   Sodium 139 134 - 144 mmol/L   Potassium 5.1 3.5 - 5.2 mmol/L   Chloride 96 96 - 106 mmol/L   CO2 28 20 - 29 mmol/L   Calcium 9.9 8.6 - 10.2 mg/dL   Total Protein 7.1 6.0 - 8.5 g/dL   Albumin 4.8 3.6 - 4.8 g/dL   Globulin, Total 2.3 1.5 - 4.5 g/dL   Albumin/Globulin Ratio 2.1 1.2 - 2.2   Bilirubin Total 0.6 0.0 - 1.2 mg/dL   Alkaline Phosphatase 63 39 - 117 IU/L   AST 41 (H) 0 - 40 IU/L   ALT 16 0 - 44 IU/L      Assessment & Plan:  1. Acute pain of right knee  Likely self limited. Will resolve. RICE. Activity as tolerated.      Follow up plan: Return if symptoms worsen or fail to improve.  Osvaldo Angst, PA-C [facility] 12/25/2017, 10:02 AM

## 2018-01-29 ENCOUNTER — Ambulatory Visit: Payer: Managed Care, Other (non HMO) | Admitting: Unknown Physician Specialty

## 2018-01-29 ENCOUNTER — Ambulatory Visit: Payer: Self-pay | Admitting: Physician Assistant

## 2018-02-05 ENCOUNTER — Ambulatory Visit: Payer: Managed Care, Other (non HMO) | Admitting: Unknown Physician Specialty

## 2018-02-08 ENCOUNTER — Other Ambulatory Visit: Payer: Self-pay | Admitting: Unknown Physician Specialty

## 2018-02-08 NOTE — Telephone Encounter (Signed)
Requested Prescriptions  Pending Prescriptions Disp Refills  . pravastatin (PRAVACHOL) 40 MG tablet [Pharmacy Med Name: PRAVASTATIN SODIUM 40 MG TAB] 90 tablet 0    Sig: TAKE 1 TABLET BY MOUTH EVERY DAY     Cardiovascular:  Antilipid - Statins Failed - 02/08/2018  1:53 AM      Failed - Total Cholesterol in normal range and within 360 days    Cholesterol, Total  Date Value Ref Range Status  07/10/2017 241 (H) 100 - 199 mg/dL Final         Failed - LDL in normal range and within 360 days    LDL Calculated  Date Value Ref Range Status  07/10/2017 131 (H) 0 - 99 mg/dL Final         Failed - Triglycerides in normal range and within 360 days    Triglycerides  Date Value Ref Range Status  07/10/2017 172 (H) 0 - 149 mg/dL Final         Passed - HDL in normal range and within 360 days    HDL  Date Value Ref Range Status  07/10/2017 76 >39 mg/dL Final         Passed - Patient is not pregnant      Passed - Valid encounter within last 12 months    Recent Outpatient Visits          1 month ago Acute pain of right knee   [facility] Jodi Marble, Adriana M, PA-C   6 months ago Hyperlipidemia, unspecified hyperlipidemia type   [facility] Gabriel Cirri, NP   1 year ago Essential hypertension   [facility] Gabriel Cirri, NP   1 year ago Annual physical exam   [facility] Gabriel Cirri, NP   2 years ago Routine general medical examination at a health care facility   [facility] Gabriel Cirri, NP      Future Appointments            In 3 days Cannady, Dorie Rank, NP [employer]

## 2018-02-11 ENCOUNTER — Ambulatory Visit: Payer: Managed Care, Other (non HMO) | Admitting: Nurse Practitioner

## 2018-02-11 ENCOUNTER — Encounter: Payer: Self-pay | Admitting: Nurse Practitioner

## 2018-02-11 VITALS — BP 138/68 | HR 91 | Temp 98.4°F | Ht 65.5 in | Wt 150.2 lb

## 2018-02-11 DIAGNOSIS — E782 Mixed hyperlipidemia: Secondary | ICD-10-CM

## 2018-02-11 DIAGNOSIS — I10 Essential (primary) hypertension: Secondary | ICD-10-CM | POA: Diagnosis not present

## 2018-02-11 NOTE — Progress Notes (Signed)
BP 138/68 (BP Location: Left Arm, Patient Position: Sitting)   Pulse 91   Temp 98.4 F (36.9 C) (Oral)   Ht 5' 5.5" (1.664 m)   Wt 150 lb 3.2 oz (68.1 kg)   SpO2 99%   BMI 24.61 kg/m    Subjective:    Patient ID: Warren Richardson, male    DOB: 1956-05-05, 62 y.o.   MRN: [id number]  HPI: Warren Richardson is a 62 y.o. male here for f/u visit for hypertension.  Denies SOB, CP, headache, palpitations.  Continues to be well-maintained on Lisinopril-HCTZ, Pravastatin, and  ASA.  Reports he has been "doing good at home".  Does BP at home every morning and at work on occasion.  Lower in afternoon vs morning.  SBP 120-130/75-80 at home, varies with it sometimes lower and higher.  He denies any ADR with current medication regimen.    Chief Complaint  Patient presents with  . Hyperlipidemia  . Hypertension    Relevant past medical, surgical, family and social history reviewed and updated as indicated. Interim medical history since our last visit reviewed. Allergies and medications reviewed and updated.    Review of Systems  Constitutional: Negative.   Respiratory: Negative.   Cardiovascular: Negative.   Musculoskeletal: Negative.   Neurological: Negative.   Psychiatric/Behavioral: Negative.     Per HPI unless specifically indicated above     Objective:    BP 138/68 (BP Location: Left Arm, Patient Position: Sitting)   Pulse 91   Temp 98.4 F (36.9 C) (Oral)   Ht 5' 5.5" (1.664 m)   Wt 150 lb 3.2 oz (68.1 kg)   SpO2 99%   BMI 24.61 kg/m   Wt Readings from Last 3 Encounters:  02/11/18 150 lb 3.2 oz (68.1 kg)  12/24/17 154 lb (69.9 kg)  07/27/17 153 lb 6.4 oz (69.6 kg)    Physical Exam  Constitutional: He is oriented to person, place, and time. He appears well-nourished.  Eyes: Pupils are equal, round, and reactive to light.  Cardiovascular: Normal rate, regular rhythm and normal heart sounds.  Pulmonary/Chest: Effort normal and breath sounds normal.  Neurological:  He is alert and oriented to person, place, and time.      Results for orders placed or performed in visit on 07/10/17  CBC with Diff  Result Value Ref Range   WBC 3.6 3.4 - 10.8 x10E3/uL   RBC 4.44 4.14 - 5.80 x10E6/uL   Hemoglobin 14.7 13.0 - 17.7 g/dL   Hematocrit 72.5 36.6 - 51.0 %   MCV 94 79 - 97 fL   MCH 33.1 (H) 26.6 - 33.0 pg   MCHC 35.4 31.5 - 35.7 g/dL   RDW 44.0 34.7 - 42.5 %   Platelets 208 150 - 379 x10E3/uL   Neutrophils 63 Not Estab. %   Lymphs 23 Not Estab. %   Monocytes 12 Not Estab. %   Eos 2 Not Estab. %   Basos 0 Not Estab. %   Neutrophils Absolute 2.2 1.4 - 7.0 x10E3/uL   Lymphocytes Absolute 0.8 0.7 - 3.1 x10E3/uL   Monocytes Absolute 0.4 0.1 - 0.9 x10E3/uL   EOS (ABSOLUTE) 0.1 0.0 - 0.4 x10E3/uL   Basophils Absolute 0.0 0.0 - 0.2 x10E3/uL   Immature Granulocytes 0 Not Estab. %   Immature Grans (Abs) 0.0 0.0 - 0.1 x10E3/uL  Uric acid  Result Value Ref Range   Uric Acid 6.6 3.7 - 8.6 mg/dL  PSA  Result Value Ref Range  Prostate Specific Ag, Serum 3.5 0.0 - 4.0 ng/mL  Lipid Profile  Result Value Ref Range   Cholesterol, Total 241 (H) 100 - 199 mg/dL   Triglycerides 161 (H) 0 - 149 mg/dL   HDL 76 >09 mg/dL   VLDL Cholesterol Cal 34 5 - 40 mg/dL   LDL Calculated 604 (H) 0 - 99 mg/dL   Chol/HDL Ratio 3.2 0.0 - 5.0 ratio  Comp Met (CMET)  Result Value Ref Range   Glucose 112 (H) 65 - 99 mg/dL   BUN 18 8 - 27 mg/dL   Creatinine, Ser 5.40 0.76 - 1.27 mg/dL   GFR calc non Af Amer 67 >59 mL/min/1.73   GFR calc Af Amer 77 >59 mL/min/1.73   BUN/Creatinine Ratio 15 10 - 24   Sodium 139 134 - 144 mmol/L   Potassium 5.1 3.5 - 5.2 mmol/L   Chloride 96 96 - 106 mmol/L   CO2 28 20 - 29 mmol/L   Calcium 9.9 8.6 - 10.2 mg/dL   Total Protein 7.1 6.0 - 8.5 g/dL   Albumin 4.8 3.6 - 4.8 g/dL   Globulin, Total 2.3 1.5 - 4.5 g/dL   Albumin/Globulin Ratio 2.1 1.2 - 2.2   Bilirubin Total 0.6 0.0 - 1.2 mg/dL   Alkaline Phosphatase 63 39 - 117 IU/L   AST 41 (H) 0  - 40 IU/L   ALT 16 0 - 44 IU/L      Assessment & Plan:   Problem List Items Addressed This Visit      Cardiovascular and Mediastinum   Hypertension    Continue current medication regimen.  Return in 6 months for physical exam and repeat labs.        Other   Mixed hyperlipidemia    Continue current medication regimen.  No ADR.           Marland Kitchenplan Follow up plan: Return in about 6 months (around 08/13/2018).

## 2018-02-11 NOTE — Assessment & Plan Note (Signed)
Continue current medication regimen.  No ADR.

## 2018-02-11 NOTE — Patient Instructions (Signed)

## 2018-02-11 NOTE — Assessment & Plan Note (Signed)
Continue current medication regimen.  Return in 6 months for physical exam and repeat labs.

## 2018-05-09 ENCOUNTER — Other Ambulatory Visit: Payer: Self-pay | Admitting: Unknown Physician Specialty

## 2018-05-09 NOTE — Telephone Encounter (Signed)
Requested medication (s) are due for refill today -yes  Requested medication (s) are on the active medication list -yes  Future visit scheduled -yes  Last refill: 02/08/18  Notes to clinic: Patient was to follow up with lab in 6 months- he did not- he does have appointment in April. Sent for provider review.  Requested Prescriptions  Pending Prescriptions Disp Refills   pravastatin (PRAVACHOL) 40 MG tablet [Pharmacy Med Name: PRAVASTATIN SODIUM 40 MG TAB] 90 tablet 0    Sig: TAKE 1 TABLET BY MOUTH EVERY DAY     Cardiovascular:  Antilipid - Statins Failed - 05/09/2018  9:30 AM      Failed - Total Cholesterol in normal range and within 360 days    Cholesterol, Total  Date Value Ref Range Status  07/10/2017 241 (H) 100 - 199 mg/dL Final         Failed - LDL in normal range and within 360 days    LDL Calculated  Date Value Ref Range Status  07/10/2017 131 (H) 0 - 99 mg/dL Final         Failed - Triglycerides in normal range and within 360 days    Triglycerides  Date Value Ref Range Status  07/10/2017 172 (H) 0 - 149 mg/dL Final         Passed - HDL in normal range and within 360 days    HDL  Date Value Ref Range Status  07/10/2017 76 >39 mg/dL Final         Passed - Patient is not pregnant      Passed - Valid encounter within last 12 months    Recent Outpatient Visits          2 months ago Essential hypertension   [facility] [place], [place] T, NP   4 months ago Acute pain of right knee   [facility] Jodi Mar[place], Adriana M, PA-C   9 months ago Hyperlipidemia, unspecified hyperlipidemia type   [facility] CirriWicker[facility] Cheryl, [facility]   1 year ago Essential hypertension   [facility] Gabriel CirriWicker, Cheryl, NP   1 year ago Annual physical exam   [facility] Cirri[facility]icker[facility] Cheryl, [facility]      Future Appointments            In 3 months Cannady, Dorie RankJolene T, NP [facility]            Requested Prescriptions   Pending Prescriptions Disp Refills   pravastatin (PRAVACHOL) 40 MG tablet [Pharmacy Med Name: PRAVASTATIN SODIUM 40 MG TAB] 90 tablet 0    Sig: TAKE 1 TABLET BY MOUTH EVERY DAY     Cardiovascular:  Antilipid - Statins Failed - 05/09/2018  9:30 AM      Failed - Total Cholesterol in normal range and within 360 days    Cholesterol, Total  Date Value Ref Range Status  07/10/2017 241 (H) 100 - 199 mg/dL Final         Failed - LDL in normal range and within 360 days    LDL Calculated  Date Value Ref Range Status  07/10/2017 131 (H) 0 - 99 mg/dL Final         Failed - Triglycerides in normal range and within 360 days    Triglycerides  Date Value Ref Range Status  07/10/2017 172 (H) 0 - 149 mg/dL Final         Passed - HDL in normal range and within 360 days  HDL  Date Value Ref Range Status  07/10/2017 76 >39 mg/dL Final         Passed - Patient is not pregnant      Passed - Valid encounter within last 12 months    Recent Outpatient Visits          2 months ago Essential hypertension   [facility] [place], [place] T, NP   4 months ago Acute pain of right knee   [facility] Osvaldo Angst M, PA-C   9 months ago Hyperlipidemia, unspecified hyperlipidemia type   [facility] Gabriel Cirri, NP   1 year ago Essential hypertension   [facility] Gabriel Cirri, NP   1 year ago Annual physical exam   [facility] Gabriel Cirri, NP      Future Appointments            In 3 months Cannady, Dorie Rank, NP [facility]

## 2018-06-07 ENCOUNTER — Other Ambulatory Visit: Payer: Self-pay | Admitting: Unknown Physician Specialty

## 2018-08-03 ENCOUNTER — Other Ambulatory Visit: Payer: Self-pay | Admitting: Unknown Physician Specialty

## 2018-08-15 ENCOUNTER — Encounter: Payer: Managed Care, Other (non HMO) | Admitting: Nurse Practitioner

## 2018-08-29 ENCOUNTER — Other Ambulatory Visit: Payer: Self-pay | Admitting: Unknown Physician Specialty

## 2018-10-25 ENCOUNTER — Other Ambulatory Visit: Payer: Self-pay | Admitting: Nurse Practitioner

## 2018-10-25 NOTE — Telephone Encounter (Signed)
Forwarding medication refill to PCP for review. 

## 2018-11-05 ENCOUNTER — Other Ambulatory Visit: Payer: Self-pay

## 2018-11-05 ENCOUNTER — Encounter: Payer: Self-pay | Admitting: Nurse Practitioner

## 2018-11-05 ENCOUNTER — Ambulatory Visit (INDEPENDENT_AMBULATORY_CARE_PROVIDER_SITE_OTHER): Payer: Managed Care, Other (non HMO) | Admitting: Nurse Practitioner

## 2018-11-05 VITALS — BP 122/78 | HR 75 | Temp 98.4°F | Ht 66.5 in | Wt 146.2 lb

## 2018-11-05 DIAGNOSIS — E782 Mixed hyperlipidemia: Secondary | ICD-10-CM

## 2018-11-05 DIAGNOSIS — I10 Essential (primary) hypertension: Secondary | ICD-10-CM

## 2018-11-05 DIAGNOSIS — Z0000 Encounter for general adult medical examination without abnormal findings: Secondary | ICD-10-CM | POA: Diagnosis not present

## 2018-11-05 NOTE — Patient Instructions (Signed)
Cholesterol Content in Foods Cholesterol is a waxy, fat-like substance that helps to carry fat in the blood. The body needs cholesterol in small amounts, but too much cholesterol can cause damage to the arteries and heart. Most people should eat less than 200 milligrams (mg) of cholesterol a day. Foods with cholesterol  Cholesterol is found in animal-based foods, such as meat, seafood, and dairy. Generally, low-fat dairy and lean meats have less cholesterol than full-fat dairy and fatty meats. The milligrams of cholesterol per serving (mg per serving) of common cholesterol-containing foods are listed below. Meat and other proteins  Egg - one large whole egg has 186 mg.  Veal shank - 4 oz has 141 mg.  Lean ground turkey (93% lean) - 4 oz has 118 mg.  Fat-trimmed lamb loin - 4 oz has 106 mg.  Lean ground beef (90% lean) - 4 oz has 100 mg.  Lobster - 3.5 oz has 90 mg.  Pork loin chops - 4 oz has 86 mg.  Canned salmon - 3.5 oz has 83 mg.  Fat-trimmed beef top loin - 4 oz has 78 mg.  Frankfurter - 1 frank (3.5 oz) has 77 mg.  Crab - 3.5 oz has 71 mg.  Roasted chicken without skin, white meat - 4 oz has 66 mg.  Light bologna - 2 oz has 45 mg.  Deli-cut turkey - 2 oz has 31 mg.  Canned tuna - 3.5 oz has 31 mg.  Bacon - 1 oz has 29 mg.  Oysters and mussels (raw) - 3.5 oz has 25 mg.  Mackerel - 1 oz has 22 mg.  Trout - 1 oz has 20 mg.  Pork sausage - 1 link (1 oz) has 17 mg.  Salmon - 1 oz has 16 mg.  Tilapia - 1 oz has 14 mg. Dairy  Soft-serve ice cream -  cup (4 oz) has 103 mg.  Whole-milk yogurt - 1 cup (8 oz) has 29 mg.  Cheddar cheese - 1 oz has 28 mg.  American cheese - 1 oz has 28 mg.  Whole milk - 1 cup (8 oz) has 23 mg.  2% milk - 1 cup (8 oz) has 18 mg.  Cream cheese - 1 tablespoon (Tbsp) has 15 mg.  Cottage cheese -  cup (4 oz) has 14 mg.  Low-fat (1%) milk - 1 cup (8 oz) has 10 mg.  Sour cream - 1 Tbsp has 8.5 mg.  Low-fat yogurt - 1 cup  (8 oz) has 8 mg.  Nonfat Greek yogurt - 1 cup (8 oz) has 7 mg.  Half-and-half cream - 1 Tbsp has 5 mg. Fats and oils  Cod liver oil - 1 tablespoon (Tbsp) has 82 mg.  Butter - 1 Tbsp has 15 mg.  Lard - 1 Tbsp has 14 mg.  Bacon grease - 1 Tbsp has 14 mg.  Mayonnaise - 1 Tbsp has 5-10 mg.  Margarine - 1 Tbsp has 3-10 mg. Exact amounts of cholesterol in these foods may vary depending on specific ingredients and brands. Foods without cholesterol Most plant-based foods do not have cholesterol unless you combine them with a food that has cholesterol. Foods without cholesterol include:  Grains and cereals.  Vegetables.  Fruits.  Vegetable oils, such as olive, canola, and sunflower oil.  Legumes, such as peas, beans, and lentils.  Nuts and seeds.  Egg whites. Summary  The body needs cholesterol in small amounts, but too much cholesterol can cause damage to the arteries and heart.    Most people should eat less than 200 milligrams (mg) of cholesterol a day. This information is not intended to replace advice given to you by your health care provider. Make sure you discuss any questions you have with your health care provider. Document Released: 12/19/2016 Document Revised: 04/06/2017 Document Reviewed: 12/19/2016 Elsevier Patient Education  2020 Elsevier Inc.  

## 2018-11-05 NOTE — Assessment & Plan Note (Signed)
Chronic, stable.  Continue current medication regimen.  Lipid profile today.

## 2018-11-05 NOTE — Progress Notes (Addendum)
BP 122/78   Pulse 75   Temp 98.4 F (36.9 C) (Oral)   Ht 5' 6.5" (1.689 m)   Wt 146 lb 3.2 oz (66.3 kg)   SpO2 100%   BMI 23.24 kg/m    Subjective:    Patient ID: Warren Richardson, male    DOB: 15-Jan-1956, 63 y.o.   MRN: [id number]  HPI: Warren Richardson is a 63 y.o. male presenting on 11/05/2018 for comprehensive medical examination. Current medical complaints include:none  He currently lives with: self + 17 cats Interim Problems from his last visit: no   HYPERTENSION / HYPERLIPIDEMIA Continues on Lisinopril-HCTZ and Pravastatin + ASA. Satisfied with current treatment? yes Duration of hypertension: chronic BP monitoring frequency: daily BP range: this morning 119/72 and average 120/80's BP medication side effects: no Duration of hyperlipidemia: chronic Cholesterol medication side effects: no Cholesterol supplements: none Medication compliance: good compliance Aspirin: yes Recent stressors: no Recurrent headaches: no Visual changes: no Palpitations: no Dyspnea: no Chest pain: no Lower extremity edema: no Dizzy/lightheaded: no  Functional Status Survey: Is the patient deaf or have difficulty hearing?: No Does the patient have difficulty seeing, even when wearing glasses/contacts?: No Does the patient have difficulty concentrating, remembering, or making decisions?: No Does the patient have difficulty walking or climbing stairs?: No Does the patient have difficulty dressing or bathing?: No Does the patient have difficulty doing errands alone such as visiting a doctor's office or shopping?: No  FALL RISK: Fall Risk  11/05/2018 12/24/2017 07/27/2017 07/11/2016 07/09/2015  Falls in the past year? 1 No No No No  Number falls in past yr: 0 - - - -  Injury with Fall? 0 - - - -  Follow up Falls evaluation completed - - - -    Depression Screen Depression screen [facility] 2/9 11/05/2018 12/24/2017 07/27/2017 07/11/2016 07/09/2015  Decreased Interest 0 0 0 0 0  Down, Depressed,  Hopeless 1 0 0 0 0  PHQ - 2 Score 1 0 0 0 0  Altered sleeping 1 - - - -  Tired, decreased energy 0 - - - -  Change in appetite 0 - - - -  Feeling bad or failure about yourself  0 - - - -  Trouble concentrating 0 - - - -  Moving slowly or fidgety/restless 0 - - - -  Suicidal thoughts 0 - - - -  PHQ-9 Score 2 - - - -  Difficult doing work/chores Not difficult at all - - - -    Advanced Directives <no information>  Past Medical History:  Past Medical History:  Diagnosis Date  . Hyperlipidemia   . Hypertension     Surgical History:  History reviewed. No pertinent surgical history.  Medications:  Current Outpatient Medications on File Prior to Visit  Medication Sig  . aspirin 81 MG chewable tablet Chew 81 mg by mouth daily.  Marland Kitchen BIOTIN PO Take 250 mg by mouth daily.  . Flavoring Agent (BLACKBERRY FLAVOR) LIQD Take by mouth.  . Ginger, Zingiber officinalis, (GINGER ROOT PO) 1 Dose once daily  . glucosamine-chondroitin 500-400 MG tablet Take 1 tablet by mouth 2 (two) times daily.  Marland Kitchen lisinopril-hydrochlorothiazide (ZESTORETIC) 20-25 MG tablet TAKE 1 TABLET BY MOUTH EVERY DAY  . Multiple Vitamin (MULTI-VITAMINS) TABS Take 1 tablet by mouth daily.  . Omega-3 Fatty Acids (FISH OIL) 1000 MG CAPS Take 1,000 mg by mouth 2 (two) times daily.  . pravastatin (PRAVACHOL) 40 MG tablet TAKE 1 TABLET BY MOUTH EVERY DAY  .  Saw Palmetto, Serenoa repens, (SAW PALMETTO PO) Take by mouth.   No current facility-administered medications on file prior to visit.     Allergies:  Allergies  Allergen Reactions  . Atorvastatin Other (See Comments)    Increased liver enzymes    Social History:  Social History   Socioeconomic History  . Marital status: Single    Spouse name: Not on file  . Number of children: Not on file  . Years of education: Not on file  . Highest education level: Not on file  Occupational History  . Not on file  Social Needs  . Financial resource strain: Not on file  .  Food insecurity    Worry: Not on file    Inability: Not on file  . Transportation needs    Medical: Not on file    Non-medical: Not on file  Tobacco Use  . Smoking status: Never Smoker  . Smokeless tobacco: Never Used  Substance and Sexual Activity  . Alcohol use: Yes    Alcohol/week: 1.0 - 2.0 standard drinks    Types: 1 - 2 Cans of beer per week  . Drug use: No  . Sexual activity: Yes  Lifestyle  . Physical activity    Days per week: Not on file    Minutes per session: Not on file  . Stress: Not on file  Relationships  . Social Musician on phone: Not on file    Gets together: Not on file    Attends religious service: Not on file    Active member of club or organization: Not on file    Attends meetings of clubs or organizations: Not on file    Relationship status: Not on file  . Intimate partner violence    Fear of current or ex partner: Not on file    Emotionally abused: Not on file    Physically abused: Not on file    Forced sexual activity: Not on file  Other Topics Concern  . Not on file  Social History Narrative  . Not on file   Social History   Tobacco Use  Smoking Status Never Smoker  Smokeless Tobacco Never Used   Social History   Substance and Sexual Activity  Alcohol Use Yes  . Alcohol/week: 1.0 - 2.0 standard drinks  . Types: 1 - 2 Cans of beer per week    Family History:  Family History  Problem Relation Age of Onset  . Arthritis Mother   . Stroke Mother   . Dementia Mother   . Cancer Father        prostate  . Diabetes Father   . Hyperlipidemia Father   . Hypertension Father   . Cancer Sister        breast    Past medical history, surgical history, medications, allergies, family history and social history reviewed with patient today and changes made to appropriate areas of the chart.   Review of Systems - negative All other ROS negative except what is listed above and in the HPI.      Objective:    BP 122/78   Pulse  75   Temp 98.4 F (36.9 C) (Oral)   Ht 5' 6.5" (1.689 m)   Wt 146 lb 3.2 oz (66.3 kg)   SpO2 100%   BMI 23.24 kg/m   Wt Readings from Last 3 Encounters:  11/05/18 146 lb 3.2 oz (66.3 kg)  02/11/18 150 lb 3.2 oz (68.1 kg)  12/24/17  154 lb (69.9 kg)    Physical Exam Vitals signs and nursing note reviewed.  Constitutional:      General: He is awake. He is not in acute distress.    Appearance: He is well-developed. He is not ill-appearing.  HENT:     Head: Normocephalic and atraumatic.     Right Ear: Hearing, tympanic membrane, ear canal and external ear normal. No drainage.     Left Ear: Hearing, tympanic membrane, ear canal and external ear normal. No drainage.     Mouth/Throat:     Pharynx: Uvula midline.  Eyes:     General: Lids are normal.        Right eye: No discharge.        Left eye: No discharge.     Extraocular Movements: Extraocular movements intact.     Conjunctiva/sclera: Conjunctivae normal.     Pupils: Pupils are equal, round, and reactive to light.     Visual Fields: Right eye visual fields normal and left eye visual fields normal.  Neck:     Musculoskeletal: Normal range of motion and neck supple.     Thyroid: No thyromegaly.     Vascular: No carotid bruit or JVD.     Trachea: Trachea normal.  Cardiovascular:     Rate and Rhythm: Normal rate and regular rhythm.     Heart sounds: Normal heart sounds, S1 normal and S2 normal. No murmur. No gallop.   Pulmonary:     Effort: Pulmonary effort is normal. No accessory muscle usage or respiratory distress.     Breath sounds: Normal breath sounds.  Abdominal:     General: Bowel sounds are normal.     Palpations: Abdomen is soft. There is no hepatomegaly or splenomegaly.     Hernia: There is no hernia in the left inguinal area or right inguinal area.  Genitourinary:    Penis: Normal.      Scrotum/Testes: Normal.     Epididymis:     Right: Normal.     Left: Normal.     Prostate: Normal.     Rectum: Normal.   Musculoskeletal: Normal range of motion.     Right lower leg: No edema.     Left lower leg: No edema.  Skin:    General: Skin is warm and dry.     Capillary Refill: Capillary refill takes less than 2 seconds.     Findings: No rash.  Neurological:     Mental Status: He is alert and oriented to person, place, and time.     Cranial Nerves: Cranial nerves are intact.     Coordination: Coordination is intact.     Gait: Gait is intact.     Deep Tendon Reflexes: Reflexes are normal and symmetric.  Psychiatric:        Attention and Perception: Attention normal.        Mood and Affect: Mood normal.        Speech: Speech normal.        Behavior: Behavior normal. Behavior is cooperative.        Thought Content: Thought content normal.        Cognition and Memory: Cognition normal.        Judgment: Judgment normal.     Results for orders placed or performed in visit on 07/10/17  CBC with Diff  Result Value Ref Range   WBC 3.6 3.4 - 10.8 x10E3/uL   RBC 4.44 4.14 - 5.80 x10E6/uL   Hemoglobin 14.7 13.0 -  17.7 g/dL   Hematocrit 29.5 62.1 - 51.0 %   MCV 94 79 - 97 fL   MCH 33.1 (H) 26.6 - 33.0 pg   MCHC 35.4 31.5 - 35.7 g/dL   RDW 30.8 65.7 - 84.6 %   Platelets 208 150 - 379 x10E3/uL   Neutrophils 63 Not Estab. %   Lymphs 23 Not Estab. %   Monocytes 12 Not Estab. %   Eos 2 Not Estab. %   Basos 0 Not Estab. %   Neutrophils Absolute 2.2 1.4 - 7.0 x10E3/uL   Lymphocytes Absolute 0.8 0.7 - 3.1 x10E3/uL   Monocytes Absolute 0.4 0.1 - 0.9 x10E3/uL   EOS (ABSOLUTE) 0.1 0.0 - 0.4 x10E3/uL   Basophils Absolute 0.0 0.0 - 0.2 x10E3/uL   Immature Granulocytes 0 Not Estab. %   Immature Grans (Abs) 0.0 0.0 - 0.1 x10E3/uL  Uric acid  Result Value Ref Range   Uric Acid 6.6 3.7 - 8.6 mg/dL  PSA  Result Value Ref Range   Prostate Specific Ag, Serum 3.5 0.0 - 4.0 ng/mL  Lipid Profile  Result Value Ref Range   Cholesterol, Total 241 (H) 100 - 199 mg/dL   Triglycerides 962 (H) 0 - 149 mg/dL    HDL 76 >95 mg/dL   VLDL Cholesterol Cal 34 5 - 40 mg/dL   LDL Calculated 284 (H) 0 - 99 mg/dL   Chol/HDL Ratio 3.2 0.0 - 5.0 ratio  Comp Met (CMET)  Result Value Ref Range   Glucose 112 (H) 65 - 99 mg/dL   BUN 18 8 - 27 mg/dL   Creatinine, Ser 1.32 0.76 - 1.27 mg/dL   GFR calc non Af Amer 67 >59 mL/min/1.73   GFR calc Af Amer 77 >59 mL/min/1.73   BUN/Creatinine Ratio 15 10 - 24   Sodium 139 134 - 144 mmol/L   Potassium 5.1 3.5 - 5.2 mmol/L   Chloride 96 96 - 106 mmol/L   CO2 28 20 - 29 mmol/L   Calcium 9.9 8.6 - 10.2 mg/dL   Total Protein 7.1 6.0 - 8.5 g/dL   Albumin 4.8 3.6 - 4.8 g/dL   Globulin, Total 2.3 1.5 - 4.5 g/dL   Albumin/Globulin Ratio 2.1 1.2 - 2.2   Bilirubin Total 0.6 0.0 - 1.2 mg/dL   Alkaline Phosphatase 63 39 - 117 IU/L   AST 41 (H) 0 - 40 IU/L   ALT 16 0 - 44 IU/L      Assessment & Plan:   Problem List Items Addressed This Visit      Cardiovascular and Mediastinum   Benign essential hypertension    Chronic, ongoing.  BP at goal today.  Continue current medication regimen.  Labs today.         Other   Mixed hyperlipidemia    Chronic, stable.  Continue current medication regimen.  Lipid profile today.      Relevant Orders   NMR, lipoprofile([facility])    Other Visit Diagnoses    Encounter for annual physical exam    -  Primary   Relevant Orders   CBC with Differential/Platelet   Comprehensive metabolic panel   PSA   TSH       Discussed aspirin prophylaxis for myocardial infarction prevention and decision was made to continue ASA  LABORATORY TESTING:  Health maintenance labs ordered today as discussed above.   The natural history of prostate cancer and ongoing controversy regarding screening and potential treatment outcomes of prostate cancer has been discussed with the  patient. The meaning of a false positive PSA and a false negative PSA has been discussed. He indicates understanding of the limitations of this screening test and  wishes to proceed with screening PSA testing.   IMMUNIZATIONS:   - Tdap: Tetanus vaccination status reviewed: last tetanus booster within 10 years. - Influenza: Up to date - Pneumovax: Not applicable - Prevnar: Not applicable - Zostavax vaccine: Refused  SCREENING: - Colonoscopy: Up to date, had done March 6th by Dr. Norma Fredrickson Discussed with patient purpose of the colonoscopy is to detect colon cancer at curable precancerous or early stages   - AAA Screening: Not applicable  -Hearing Test: Not applicable  -Spirometry: Not applicable   PATIENT COUNSELING:    Sexuality: Discussed sexually transmitted diseases, partner selection, use of condoms, avoidance of unintended pregnancy  and contraceptive alternatives.   Advised to avoid cigarette smoking.  I discussed with the patient that most people either abstain from alcohol or drink within safe limits (<=14/week and <=4 drinks/occasion for males, <=7/weeks and <= 3 drinks/occasion for females) and that the risk for alcohol disorders and other health effects rises proportionally with the number of drinks per week and how often a drinker exceeds daily limits.  Discussed cessation/primary prevention of drug use and availability of treatment for abuse.   Diet: Encouraged to adjust caloric intake to maintain  or achieve ideal body weight, to reduce intake of dietary saturated fat and total fat, to limit sodium intake by avoiding high sodium foods and not adding table salt, and to maintain adequate dietary potassium and calcium preferably from fresh fruits, vegetables, and low-fat dairy products.    stressed the importance of regular exercise  Injury prevention: Discussed safety belts, safety helmets, smoke detector, smoking near bedding or upholstery.   Dental health: Discussed importance of regular tooth brushing, flossing, and dental visits.   Follow up plan: NEXT PREVENTATIVE PHYSICAL DUE IN 1 YEAR. Return in about 6 months (around  05/07/2019) for HTN/HLD.

## 2018-11-05 NOTE — Assessment & Plan Note (Signed)
Chronic, ongoing.  BP at goal today.  Continue current medication regimen.  Labs today.

## 2018-11-06 LAB — CBC WITH DIFFERENTIAL/PLATELET
Basophils Absolute: 0 10*3/uL (ref 0.0–0.2)
Basos: 1 %
EOS (ABSOLUTE): 0 10*3/uL (ref 0.0–0.4)
Eos: 1 %
Hematocrit: 38.4 % (ref 37.5–51.0)
Hemoglobin: 13.8 g/dL (ref 13.0–17.7)
Immature Grans (Abs): 0 10*3/uL (ref 0.0–0.1)
Immature Granulocytes: 1 %
Lymphocytes Absolute: 0.7 10*3/uL (ref 0.7–3.1)
Lymphs: 16 %
MCH: 34.5 pg — ABNORMAL HIGH (ref 26.6–33.0)
MCHC: 35.9 g/dL — ABNORMAL HIGH (ref 31.5–35.7)
MCV: 96 fL (ref 79–97)
Monocytes Absolute: 0.5 10*3/uL (ref 0.1–0.9)
Monocytes: 11 %
Neutrophils Absolute: 3 10*3/uL (ref 1.4–7.0)
Neutrophils: 70 %
Platelets: 199 10*3/uL (ref 150–450)
RBC: 4 x10E6/uL — ABNORMAL LOW (ref 4.14–5.80)
RDW: 12.8 % (ref 11.6–15.4)
WBC: 4.2 10*3/uL (ref 3.4–10.8)

## 2018-11-06 LAB — COMPREHENSIVE METABOLIC PANEL
ALT: 12 IU/L (ref 0–44)
AST: 29 IU/L (ref 0–40)
Albumin/Globulin Ratio: 2.4 — ABNORMAL HIGH (ref 1.2–2.2)
Albumin: 5 g/dL — ABNORMAL HIGH (ref 3.8–4.8)
Alkaline Phosphatase: 67 IU/L (ref 39–117)
BUN/Creatinine Ratio: 19 (ref 10–24)
BUN: 21 mg/dL (ref 8–27)
Bilirubin Total: 0.4 mg/dL (ref 0.0–1.2)
CO2: 22 mmol/L (ref 20–29)
Calcium: 9.3 mg/dL (ref 8.6–10.2)
Chloride: 96 mmol/L (ref 96–106)
Creatinine, Ser: 1.1 mg/dL (ref 0.76–1.27)
GFR calc Af Amer: 82 mL/min/{1.73_m2} (ref >59)
GFR calc non Af Amer: 71 mL/min/{1.73_m2} (ref >59)
Globulin, Total: 2.1 g/dL (ref 1.5–4.5)
Glucose: 109 mg/dL — ABNORMAL HIGH (ref 65–99)
Potassium: 4.2 mmol/L (ref 3.5–5.2)
Sodium: 138 mmol/L (ref 134–144)
Total Protein: 7.1 g/dL (ref 6.0–8.5)

## 2018-11-06 LAB — NMR, LIPOPROFILE
Cholesterol, Total: 203 mg/dL — ABNORMAL HIGH (ref 100–199)
HDL Particle Number: 40.9 umol/L (ref >=30.5)
HDL-C: 66 mg/dL (ref >39)
LDL Particle Number: 1336 nmol/L — ABNORMAL HIGH (ref <1000)
LDL Size: 21 nm (ref >20.5)
LDL-C: 119 mg/dL — ABNORMAL HIGH (ref 0–99)
LP-IR Score: 39 (ref <=45)
Small LDL Particle Number: 602 nmol/L — ABNORMAL HIGH (ref <=527)
Triglycerides: 90 mg/dL (ref 0–149)

## 2018-11-06 LAB — PSA: Prostate Specific Ag, Serum: 3.1 ng/mL (ref 0.0–4.0)

## 2018-11-06 LAB — TSH: TSH: 1.61 u[IU]/mL (ref 0.450–4.500)

## 2018-11-20 ENCOUNTER — Other Ambulatory Visit: Payer: Self-pay | Admitting: Unknown Physician Specialty

## 2019-01-20 ENCOUNTER — Other Ambulatory Visit: Payer: Self-pay | Admitting: Nurse Practitioner

## 2019-03-03 IMAGING — CR DG ELBOW COMPLETE 3+V*L*
1 series · 4 of 4 positions shown · non-contrast
Comparison: None.

CLINICAL DATA: Swelling and redness in the olecranon region.

EXAM:
LEFT ELBOW - COMPLETE 3+ VIEW

[Series 1: dg elbow complete left (3+view) · 0.14mm/px · 4 of 4 slices shown]
[im 1/4]
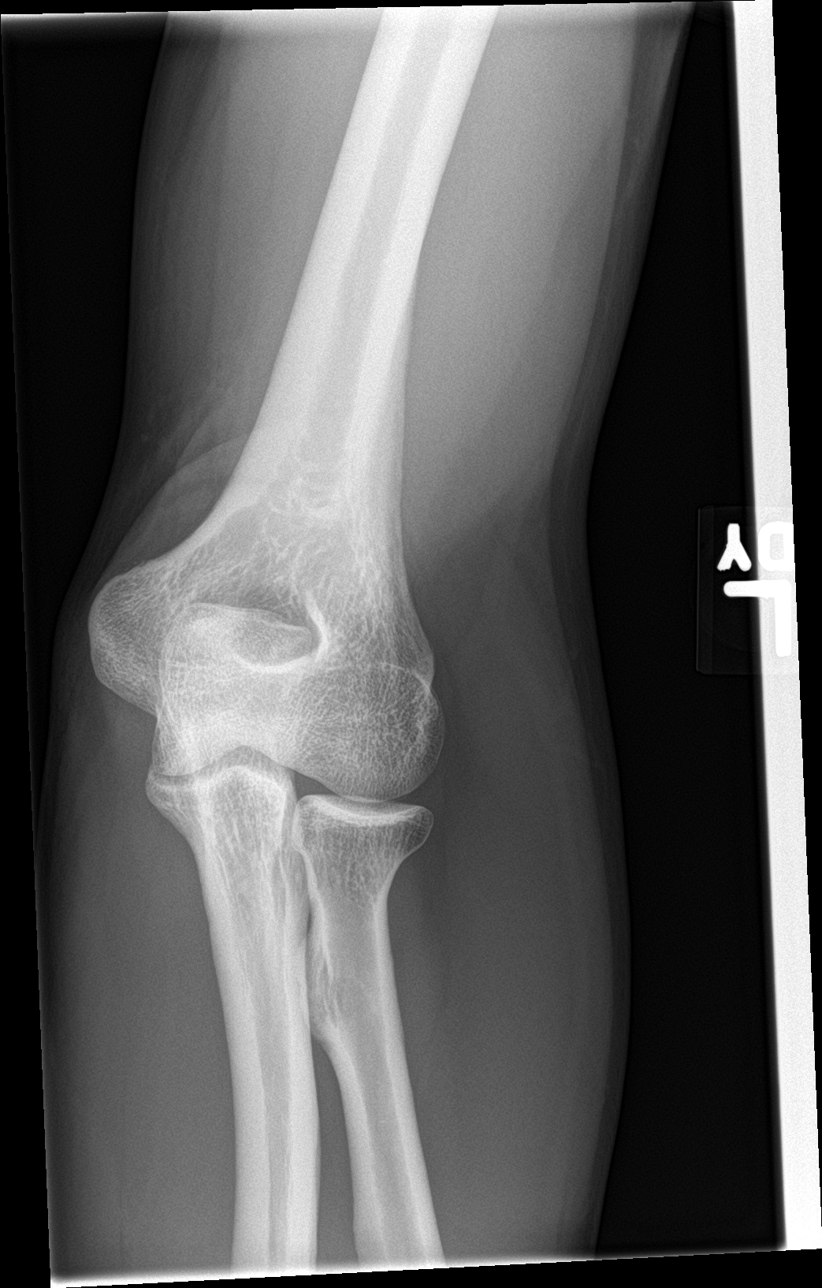
[im 2/4]
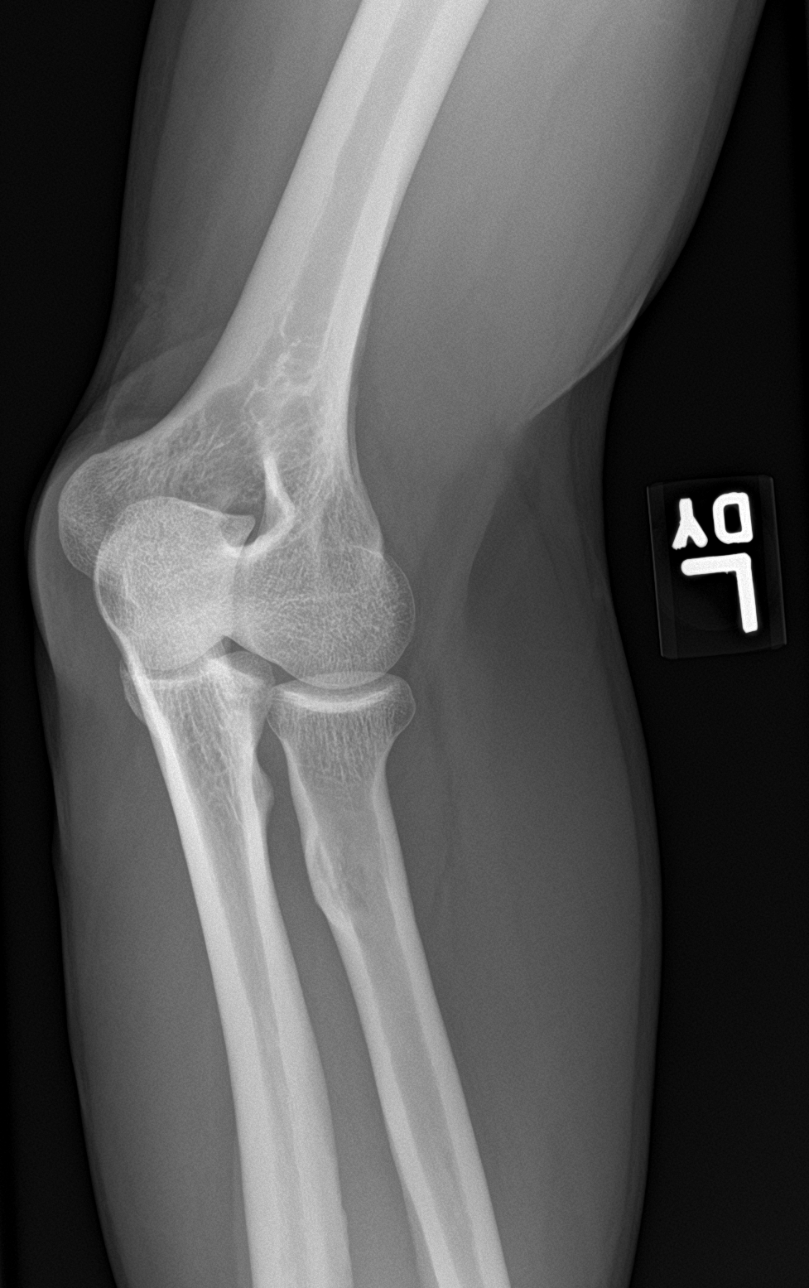
[im 3/4]
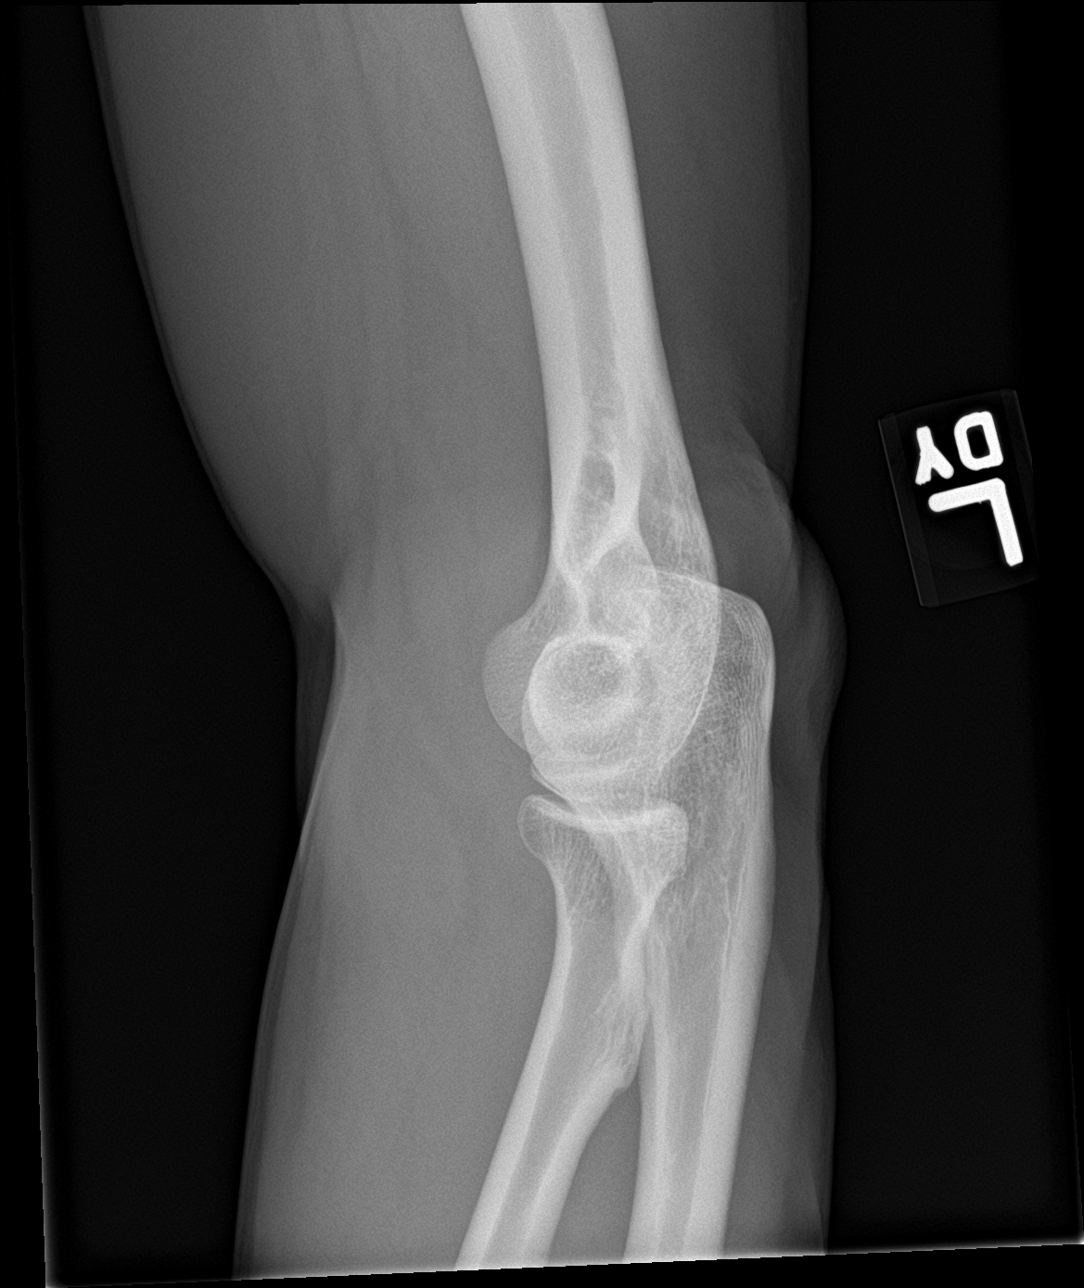
[im 4/4]
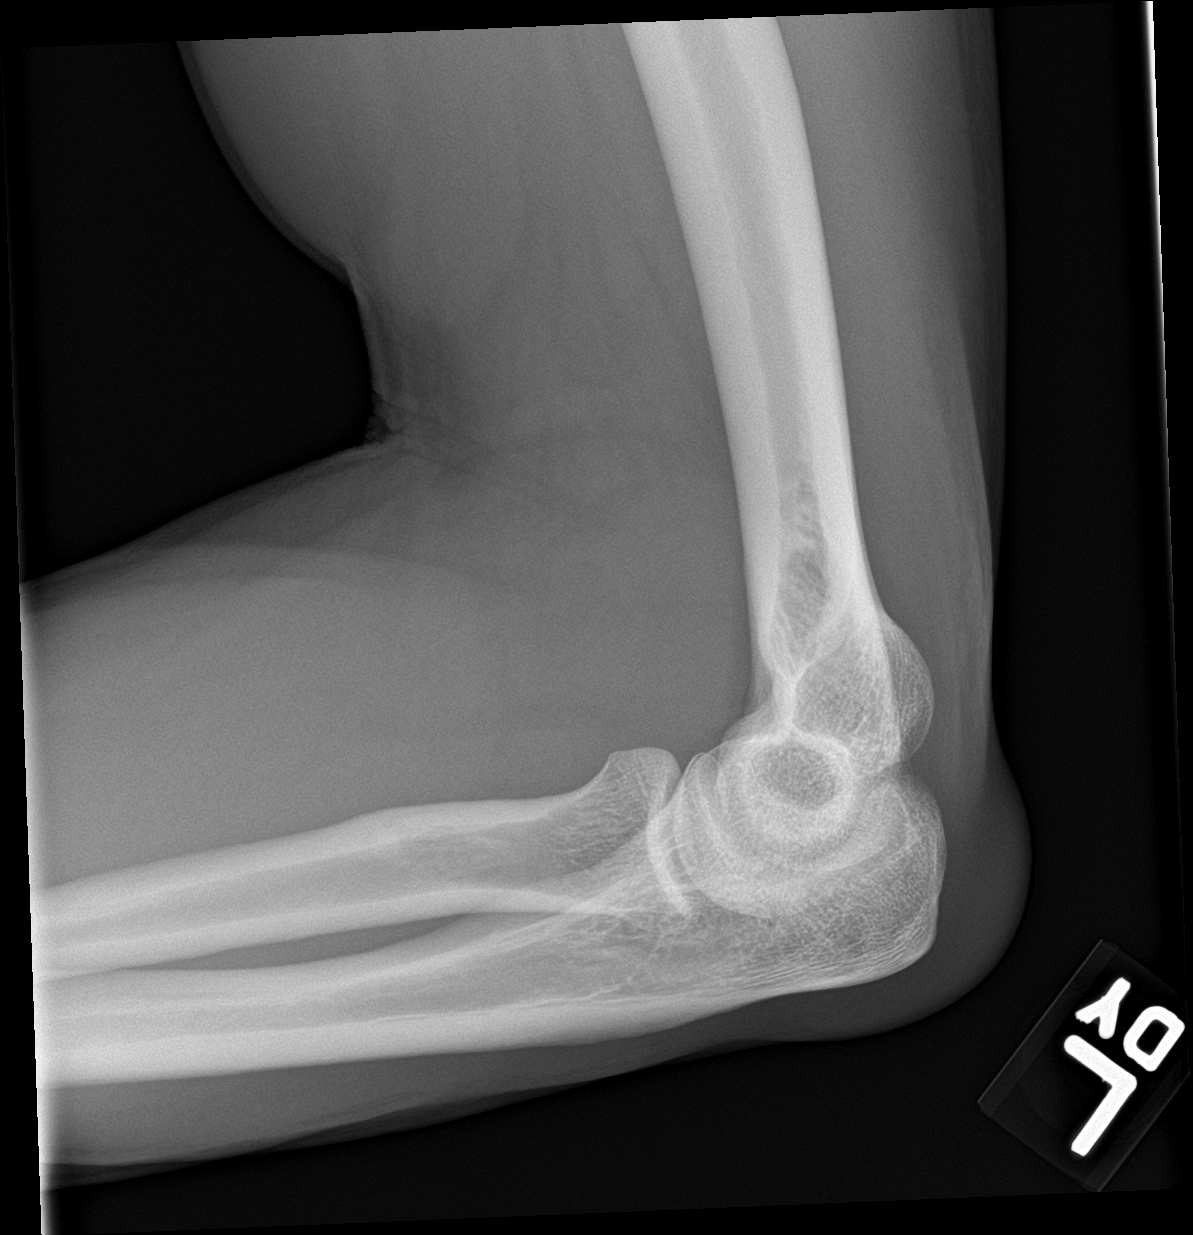

[4 of 4 positions shown; findings below may reference images not displayed]

FINDINGS: Nonspecific soft tissue swelling overlying the olecranon consistent
with olecranon bursitis. No bone or joint finding.
IMPRESSION: Soft tissue swelling overlying the olecranon consistent with
bursitis. Otherwise normal.

## 2019-03-18 ENCOUNTER — Other Ambulatory Visit: Payer: Self-pay | Admitting: Unknown Physician Specialty

## 2019-04-24 ENCOUNTER — Other Ambulatory Visit: Payer: Self-pay | Admitting: Nurse Practitioner

## 2019-04-24 NOTE — Telephone Encounter (Signed)
Forwarding medication refill request to the PCP for review. 

## 2019-05-07 ENCOUNTER — Encounter: Payer: Self-pay | Admitting: Nurse Practitioner

## 2019-05-07 ENCOUNTER — Other Ambulatory Visit: Payer: Self-pay

## 2019-05-07 ENCOUNTER — Ambulatory Visit (INDEPENDENT_AMBULATORY_CARE_PROVIDER_SITE_OTHER): Payer: Managed Care, Other (non HMO) | Admitting: Nurse Practitioner

## 2019-05-07 DIAGNOSIS — I10 Essential (primary) hypertension: Secondary | ICD-10-CM | POA: Diagnosis not present

## 2019-05-07 DIAGNOSIS — E782 Mixed hyperlipidemia: Secondary | ICD-10-CM | POA: Diagnosis not present

## 2019-05-07 MED ORDER — LISINOPRIL-HYDROCHLOROTHIAZIDE 20-25 MG PO TABS: 1.0000 | ORAL_TABLET | Freq: Every day | ORAL | 3 refills | Status: DC

## 2019-05-07 NOTE — Assessment & Plan Note (Addendum)
Chronic, stable.  Continue current medication regimen and adjust as needed.  Labs at next visit per patient request.  Return in 6 months for physical.

## 2019-05-07 NOTE — Assessment & Plan Note (Addendum)
Chronic, ongoing.  BP at goal on average on home readings, occasional elevations noted before taking medication (this morning that is case).  Continue current medication regimen and adjust as needed.  Labs at next visit per patient request.  Return in 6 months for physical.

## 2019-05-07 NOTE — Progress Notes (Signed)
BP (!) 161/82   Pulse 80    Subjective:    Patient ID: Warren Richardson, male    DOB: January 29, 1956, 63 y.o.   MRN: [id number]  HPI: Warren Richardson is a 63 y.o. male  Chief Complaint  Patient presents with  . Hyperlipidemia  . Hypertension    . This visit was completed via D[employer] due to the restrictions of the COVID-19 pandemic. All issues as above were discussed and addressed. Physical exam was done as above through visual confirmation on Doximity. If it was felt that the patient should be evaluated in the office, they were directed there. The patient verbally consented to this visit. . Location of the patient: home . Location of the provider: work . Those involved with this call:  . Provider: Aura Dials, DNP . CMA: Wilhemena Durie, CMA . Front Desk/Registration: Adela Ports  . Time spent on call: 15 minutes with patient face to face via video conference. More than 50% of this time was spent in counseling and coordination of care. 10 minutes total spent in review of patient's record and preparation of their chart.  . I verified patient identity using two factors (patient name and date of birth). Patient consents verbally to being seen via telemedicine visit today.    HYPERTENSION / HYPERLIPIDEMIA Continues on Lisinopril-HCTZ and Pravastatin + ASA. Satisfied with current treatment? yes Duration of hypertension: chronic BP monitoring frequency: daily BP range: 103/60 to 160/80, on average 120/80's BP medication side effects: no Duration of hyperlipidemia: chronic Cholesterol medication side effects: no Cholesterol supplements: none Medication compliance: good compliance Aspirin: yes Recent stressors: no Recurrent headaches: no Visual changes: no Palpitations: no Dyspnea: no Chest pain: no Lower extremity edema: no Dizzy/lightheaded: no  Relevant past medical, surgical, family and social history reviewed and updated as indicated. Interim medical  history since our last visit reviewed. Allergies and medications reviewed and updated.  Review of Systems  Constitutional: Negative for activity change, diaphoresis, fatigue and fever.  Respiratory: Negative for cough, chest tightness, shortness of breath and wheezing.   Cardiovascular: Negative for chest pain, palpitations and leg swelling.  Neurological: Negative.   Psychiatric/Behavioral: Negative.     Per HPI unless specifically indicated above     Objective:    BP (!) 161/82   Pulse 80   Wt Readings from Last 3 Encounters:  11/05/18 146 lb 3.2 oz (66.3 kg)  02/11/18 150 lb 3.2 oz (68.1 kg)  12/24/17 154 lb (69.9 kg)    Physical Exam Vitals and nursing note reviewed.  Constitutional:      General: He is awake. He is not in acute distress.    Appearance: He is well-developed. He is not ill-appearing.  HENT:     Head: Normocephalic.     Right Ear: Hearing normal. No drainage.     Left Ear: Hearing normal. No drainage.  Eyes:     General: Lids are normal.        Right eye: No discharge.        Left eye: No discharge.     Conjunctiva/sclera: Conjunctivae normal.  Pulmonary:     Effort: Pulmonary effort is normal. No accessory muscle usage or respiratory distress.  Musculoskeletal:     Cervical back: Normal range of motion.  Neurological:     Mental Status: He is alert and oriented to person, place, and time.  Psychiatric:        Mood and Affect: Mood normal.  Behavior: Behavior normal. Behavior is cooperative.        Thought Content: Thought content normal.        Judgment: Judgment normal.     Results for orders placed or performed in visit on 11/05/18  CBC with Differential/Platelet  Result Value Ref Range   WBC 4.2 3.4 - 10.8 x10E3/uL   RBC 4.00 (L) 4.14 - 5.80 x10E6/uL   Hemoglobin 13.8 13.0 - 17.7 g/dL   Hematocrit 16.1 09.6 - 51.0 %   MCV 96 79 - 97 fL   MCH 34.5 (H) 26.6 - 33.0 pg   MCHC 35.9 (H) 31.5 - 35.7 g/dL   RDW 04.5 40.9 - 81.1 %    Platelets 199 150 - 450 x10E3/uL   Neutrophils 70 Not Estab. %   Lymphs 16 Not Estab. %   Monocytes 11 Not Estab. %   Eos 1 Not Estab. %   Basos 1 Not Estab. %   Neutrophils Absolute 3.0 1.4 - 7.0 x10E3/uL   Lymphocytes Absolute 0.7 0.7 - 3.1 x10E3/uL   Monocytes Absolute 0.5 0.1 - 0.9 x10E3/uL   EOS (ABSOLUTE) 0.0 0.0 - 0.4 x10E3/uL   Basophils Absolute 0.0 0.0 - 0.2 x10E3/uL   Immature Granulocytes 1 Not Estab. %   Immature Grans (Abs) 0.0 0.0 - 0.1 x10E3/uL  Comprehensive metabolic panel  Result Value Ref Range   Glucose 109 (H) 65 - 99 mg/dL   BUN 21 8 - 27 mg/dL   Creatinine, Ser 9.14 0.76 - 1.27 mg/dL   GFR calc non Af Amer 71 >59 mL/min/1.73   GFR calc Af Amer 82 >59 mL/min/1.73   BUN/Creatinine Ratio 19 10 - 24   Sodium 138 134 - 144 mmol/L   Potassium 4.2 3.5 - 5.2 mmol/L   Chloride 96 96 - 106 mmol/L   CO2 22 20 - 29 mmol/L   Calcium 9.3 8.6 - 10.2 mg/dL   Total Protein 7.1 6.0 - 8.5 g/dL   Albumin 5.0 (H) 3.8 - 4.8 g/dL   Globulin, Total 2.1 1.5 - 4.5 g/dL   Albumin/Globulin Ratio 2.4 (H) 1.2 - 2.2   Bilirubin Total 0.4 0.0 - 1.2 mg/dL   Alkaline Phosphatase 67 39 - 117 IU/L   AST 29 0 - 40 IU/L   ALT 12 0 - 44 IU/L  PSA  Result Value Ref Range   Prostate Specific Ag, Serum 3.1 0.0 - 4.0 ng/mL  TSH  Result Value Ref Range   TSH 1.610 0.450 - 4.500 uIU/mL  NMR, lipoprofile([facility])  Result Value Ref Range   LDL Particle Number 1,336 (H) <1,000 nmol/L   LDL-C 119 (H) 0 - 99 mg/dL   HDL-C 66 >78 mg/dL   Triglycerides 90 0 - 149 mg/dL   Cholesterol, Total 295 (H) 100 - 199 mg/dL   HDL Particle Number 62.1 >=30.5 umol/L   Small LDL Particle Number 602 (H) <=527 nmol/L   LDL Size 21.0 >20.5 nm   LP-IR Score 39 <=45      Assessment & Plan:   Problem List Items Addressed This Visit      Cardiovascular and Mediastinum   Benign essential hypertension    Chronic, ongoing.  BP at goal on average on home readings, occasional elevations noted before  taking medication (this morning that is case).  Continue current medication regimen and adjust as needed.  Labs at next visit per patient request.  Return in 6 months for physical.      Relevant Medications   lisinopril-hydrochlorothiazide (  ZESTORETIC) 20-25 MG tablet     Other   Mixed hyperlipidemia    Chronic, stable.  Continue current medication regimen and adjust as needed.  Labs at next visit per patient request.  Return in 6 months for physical.      Relevant Medications   lisinopril-hydrochlorothiazide (ZESTORETIC) 20-25 MG tablet      I discussed the assessment and treatment plan with the patient. The patient was provided an opportunity to ask questions and all were answered. The patient agreed with the plan and demonstrated an understanding of the instructions.   The patient was advised to call back or seek an in-person evaluation if the symptoms worsen or if the condition fails to improve as anticipated.   I provided 15 minutes of time during this encounter.  Follow up plan: Return in about 6 months (around 11/05/2019) for Annual physical due .

## 2019-05-07 NOTE — Patient Instructions (Signed)

## 2019-05-07 NOTE — Telephone Encounter (Signed)
Appointment changed, Pt understood and verbalized understanding.

## 2019-09-19 ENCOUNTER — Encounter: Payer: Self-pay | Admitting: Nurse Practitioner

## 2019-09-19 ENCOUNTER — Ambulatory Visit (INDEPENDENT_AMBULATORY_CARE_PROVIDER_SITE_OTHER): Payer: Managed Care, Other (non HMO) | Admitting: Nurse Practitioner

## 2019-09-19 ENCOUNTER — Other Ambulatory Visit: Payer: Self-pay

## 2019-09-19 DIAGNOSIS — R200 Anesthesia of skin: Secondary | ICD-10-CM

## 2019-09-19 NOTE — Progress Notes (Signed)
BP 137/80 (BP Location: Left Arm, Cuff Size: Normal)   Pulse 87   Temp 98 F (36.7 C) (Oral)   Wt 154 lb (69.9 kg)   SpO2 98%   BMI 24.48 kg/m    Subjective:    Patient ID: Warren Richardson, male    DOB: August 27, 1955, 64 y.o.   MRN: [id number]  HPI: Warren Richardson is a 64 y.o. male  Chief Complaint  Patient presents with  . Foot Problem    pt states his R foot has been going numb for the past 3 days. States it is mainly in his right big toe and the ball of his foot. States it is better today.    FOOT NUMBNESS Reports foot numbness to ball of right foot past few days, but improved today.  States it is not nearly as noticeable today.  Is on pedal aspect underneath great toe area and radiates back to arch.  No burning or pain, more numb feeling.  Has not had this before.  Works in [employer] and sits a lot + walks often.  Does go to gym, cardio three days a week and lifts three days a week -- started back to working out in March.  Current work out shoes are 64 years old. Duration: days Involved foot: right Mechanism of injury: unknown Location: edal aspect underneath great toe area and radiates back to arch Onset: gradual  Severity: mild  Quality: numbness -- no issues with walking Frequency: intermittent Radiation: no Aggravating factors: resting Alleviating factors: elliptical felt good the other day Status: better Treatments attempted: nothing  Relief with NSAIDs?:  No NSAIDs Taken Weakness with weight bearing or walking: no Morning stiffness: no Swelling: no Redness: no Bruising: no Paresthesias / decreased sensation: yes  Fevers:no  Relevant past medical, surgical, family and social history reviewed and updated as indicated. Interim medical history since our last visit reviewed. Allergies and medications reviewed and updated.  Review of Systems  Constitutional: Negative for activity change, diaphoresis, fatigue and fever.  Respiratory: Negative for cough, chest  tightness, shortness of breath and wheezing.   Cardiovascular: Negative for chest pain, palpitations and leg swelling.  Gastrointestinal: Negative.   Endocrine: Negative for cold intolerance, heat intolerance, polydipsia, polyphagia and polyuria.  Neurological: Negative.   Psychiatric/Behavioral: Negative.     Per HPI unless specifically indicated above     Objective:    BP 137/80 (BP Location: Left Arm, Cuff Size: Normal)   Pulse 87   Temp 98 F (36.7 C) (Oral)   Wt 154 lb (69.9 kg)   SpO2 98%   BMI 24.48 kg/m   Wt Readings from Last 3 Encounters:  09/19/19 154 lb (69.9 kg)  11/05/18 146 lb 3.2 oz (66.3 kg)  02/11/18 150 lb 3.2 oz (68.1 kg)    Physical Exam Vitals and nursing note reviewed.  Constitutional:      General: He is awake. He is not in acute distress.    Appearance: He is well-developed and well-groomed. He is not ill-appearing.  HENT:     Head: Normocephalic and atraumatic.     Right Ear: Hearing normal. No drainage.     Left Ear: Hearing normal. No drainage.  Eyes:     General: Lids are normal.        Right eye: No discharge.        Left eye: No discharge.     Conjunctiva/sclera: Conjunctivae normal.     Pupils: Pupils are equal, round, and reactive to  light.  Neck:     Vascular: No carotid bruit.  Cardiovascular:     Rate and Rhythm: Normal rate and regular rhythm.     Pulses:          Dorsalis pedis pulses are 2+ on the right side and 2+ on the left side.       Posterior tibial pulses are 2+ on the right side and 2+ on the left side.     Heart sounds: Normal heart sounds, S1 normal and S2 normal. No murmur. No gallop.   Pulmonary:     Effort: Pulmonary effort is normal. No accessory muscle usage or respiratory distress.     Breath sounds: Normal breath sounds.  Abdominal:     General: Bowel sounds are normal.     Palpations: Abdomen is soft.  Musculoskeletal:        General: Normal range of motion.     Cervical back: Normal range of motion and  neck supple.     Right lower leg: No edema.     Left lower leg: No edema.     Right foot: Normal range of motion. No deformity or bunion.     Left foot: Normal range of motion. No deformity or bunion.       Feet:  Feet:     Right foot:     Protective Sensation: 10 sites tested. 10 sites sensed.     Skin integrity: Skin integrity normal.     Toenail Condition: Right toenails are normal.     Left foot:     Protective Sensation: 10 sites tested. 10 sites sensed.     Skin integrity: Skin integrity normal.     Toenail Condition: Left toenails are normal.  Skin:    General: Skin is warm and dry.     Capillary Refill: Capillary refill takes less than 2 seconds.  Neurological:     Mental Status: He is alert and oriented to person, place, and time.  Psychiatric:        Attention and Perception: Attention normal.        Mood and Affect: Mood normal.        Speech: Speech normal.        Behavior: Behavior normal. Behavior is cooperative.        Thought Content: Thought content normal.     Results for orders placed or performed in visit on 11/05/18  CBC with Differential/Platelet  Result Value Ref Range   WBC 4.2 3.4 - 10.8 x10E3/uL   RBC 4.00 (L) 4.14 - 5.80 x10E6/uL   Hemoglobin 13.8 13.0 - 17.7 g/dL   Hematocrit 16.1 09.6 - 51.0 %   MCV 96 79 - 97 fL   MCH 34.5 (H) 26.6 - 33.0 pg   MCHC 35.9 (H) 31.5 - 35.7 g/dL   RDW 04.5 40.9 - 81.1 %   Platelets 199 150 - 450 x10E3/uL   Neutrophils 70 Not Estab. %   Lymphs 16 Not Estab. %   Monocytes 11 Not Estab. %   Eos 1 Not Estab. %   Basos 1 Not Estab. %   Neutrophils Absolute 3.0 1.4 - 7.0 x10E3/uL   Lymphocytes Absolute 0.7 0.7 - 3.1 x10E3/uL   Monocytes Absolute 0.5 0.1 - 0.9 x10E3/uL   EOS (ABSOLUTE) 0.0 0.0 - 0.4 x10E3/uL   Basophils Absolute 0.0 0.0 - 0.2 x10E3/uL   Immature Granulocytes 1 Not Estab. %   Immature Grans (Abs) 0.0 0.0 - 0.1 x10E3/uL  Comprehensive metabolic panel  Result Value Ref Range   Glucose 109 (H) 65  - 99 mg/dL   BUN 21 8 - 27 mg/dL   Creatinine, Ser 1.61 0.76 - 1.27 mg/dL   GFR calc non Af Amer 71 >59 mL/min/1.73   GFR calc Af Amer 82 >59 mL/min/1.73   BUN/Creatinine Ratio 19 10 - 24   Sodium 138 134 - 144 mmol/L   Potassium 4.2 3.5 - 5.2 mmol/L   Chloride 96 96 - 106 mmol/L   CO2 22 20 - 29 mmol/L   Calcium 9.3 8.6 - 10.2 mg/dL   Total Protein 7.1 6.0 - 8.5 g/dL   Albumin 5.0 (H) 3.8 - 4.8 g/dL   Globulin, Total 2.1 1.5 - 4.5 g/dL   Albumin/Globulin Ratio 2.4 (H) 1.2 - 2.2   Bilirubin Total 0.4 0.0 - 1.2 mg/dL   Alkaline Phosphatase 67 39 - 117 IU/L   AST 29 0 - 40 IU/L   ALT 12 0 - 44 IU/L  PSA  Result Value Ref Range   Prostate Specific Ag, Serum 3.1 0.0 - 4.0 ng/mL  TSH  Result Value Ref Range   TSH 1.610 0.450 - 4.500 uIU/mL  NMR, lipoprofile([facility])  Result Value Ref Range   LDL Particle Number 1,336 (H) <1,000 nmol/L   LDL-C 119 (H) 0 - 99 mg/dL   HDL-C 66 >09 mg/dL   Triglycerides 90 0 - 149 mg/dL   Cholesterol, Total 604 (H) 100 - 199 mg/dL   HDL Particle Number 54.0 >=30.5 umol/L   Small LDL Particle Number 602 (H) <=527 nmol/L   LDL Size 21.0 >20.5 nm   LP-IR Score 39 <=45      Assessment & Plan:   Problem List Items Addressed This Visit      Other   Numbness of right foot    Acute to pedal aspect, with improvement at this time.  Suspect tendonitis.  Recommend use of Tylenol as needed + rolling foot with lacross ball and stretching well after working out.  May benefit from new running shoes to use while working out and ensure not to tie laces too tight.  Good stretching prior to working out and afterwards.  Return to office for worsening or ongoing symptoms, could consider short burst Prednisone if needed.            Follow up plan: Return for as scheduled in 2 months.

## 2019-09-19 NOTE — Patient Instructions (Signed)

## 2019-09-19 NOTE — Assessment & Plan Note (Signed)
Acute to pedal aspect, with improvement at this time.  Suspect tendonitis.  Recommend use of Tylenol as needed + rolling foot with lacross ball and stretching well after working out.  May benefit from new running shoes to use while working out and ensure not to tie laces too tight.  Good stretching prior to working out and afterwards.  Return to office for worsening or ongoing symptoms, could consider short burst Prednisone if needed.

## 2019-10-01 MED ORDER — PREDNISONE 10 MG PO TABS: ORAL_TABLET | ORAL | 0 refills | Status: DC

## 2019-11-03 ENCOUNTER — Encounter: Payer: Managed Care, Other (non HMO) | Admitting: Nurse Practitioner

## 2019-11-07 ENCOUNTER — Ambulatory Visit (INDEPENDENT_AMBULATORY_CARE_PROVIDER_SITE_OTHER): Payer: Managed Care, Other (non HMO) | Admitting: Nurse Practitioner

## 2019-11-07 ENCOUNTER — Encounter: Payer: Self-pay | Admitting: Nurse Practitioner

## 2019-11-07 ENCOUNTER — Other Ambulatory Visit: Payer: Self-pay

## 2019-11-07 VITALS — BP 107/67 | HR 78 | Temp 97.7°F | Ht 66.1 in | Wt 150.6 lb

## 2019-11-07 DIAGNOSIS — I10 Essential (primary) hypertension: Secondary | ICD-10-CM | POA: Diagnosis not present

## 2019-11-07 DIAGNOSIS — E782 Mixed hyperlipidemia: Secondary | ICD-10-CM

## 2019-11-07 DIAGNOSIS — Z0000 Encounter for general adult medical examination without abnormal findings: Secondary | ICD-10-CM

## 2019-11-07 DIAGNOSIS — Z125 Encounter for screening for malignant neoplasm of prostate: Secondary | ICD-10-CM

## 2019-11-07 NOTE — Assessment & Plan Note (Signed)
Chronic, stable.  Continue current medication regimen and adjust as needed.  Lipid panel today.  Return in 6 months. 

## 2019-11-07 NOTE — Patient Instructions (Signed)
Healthy Eating Following a healthy eating pattern may help you to achieve and maintain a healthy body weight, reduce the risk of chronic disease, and live a long and productive life. It is important to follow a healthy eating pattern at an appropriate calorie level for your body. Your nutritional needs should be met primarily through food by choosing a variety of nutrient-rich foods. What are tips for following this plan? Reading food labels  Read labels and choose the following: ? Reduced or low sodium. ? Juices with 100% fruit juice. ? Foods with low saturated fats and high polyunsaturated and monounsaturated fats. ? Foods with whole grains, such as whole wheat, cracked wheat, brown rice, and wild rice. ? Whole grains that are fortified with folic acid. This is recommended for women who are pregnant or who want to become pregnant.  Read labels and avoid the following: ? Foods with a lot of added sugars. These include foods that contain brown sugar, corn sweetener, corn syrup, dextrose, fructose, glucose, high-fructose corn syrup, honey, invert sugar, lactose, malt syrup, maltose, molasses, raw sugar, sucrose, trehalose, or turbinado sugar.  Do not eat more than the following amounts of added sugar per day:  6 teaspoons (25 g) for women.  9 teaspoons (38 g) for men. ? Foods that contain processed or refined starches and grains. ? Refined grain products, such as white flour, degermed cornmeal, white bread, and white rice. Shopping  Choose nutrient-rich snacks, such as vegetables, whole fruits, and nuts. Avoid high-calorie and high-sugar snacks, such as potato chips, fruit snacks, and candy.  Use oil-based dressings and spreads on foods instead of solid fats such as butter, stick margarine, or cream cheese.  Limit pre-made sauces, mixes, and "instant" products such as flavored rice, instant noodles, and ready-made pasta.  Try more plant-protein sources, such as tofu, tempeh, black beans,  edamame, lentils, nuts, and seeds.  Explore eating plans such as the Mediterranean diet or vegetarian diet. Cooking  Use oil to saut or stir-fry foods instead of solid fats such as butter, stick margarine, or lard.  Try baking, boiling, grilling, or broiling instead of frying.  Remove the fatty part of meats before cooking.  Steam vegetables in water or broth. Meal planning   At meals, imagine dividing your plate into fourths: ? One-half of your plate is fruits and vegetables. ? One-fourth of your plate is whole grains. ? One-fourth of your plate is protein, especially lean meats, poultry, eggs, tofu, beans, or nuts.  Include low-fat dairy as part of your daily diet. Lifestyle  Choose healthy options in all settings, including home, work, school, restaurants, or stores.  Prepare your food safely: ? Wash your hands after handling raw meats. ? Keep food preparation surfaces clean by regularly washing with hot, soapy water. ? Keep raw meats separate from ready-to-eat foods, such as fruits and vegetables. ? Cook seafood, meat, poultry, and eggs to the recommended internal temperature. ? Store foods at safe temperatures. In general:  Keep cold foods at 59F (4.4C) or below.  Keep hot foods at 159F (60C) or above.  Keep your freezer at [facility] (-17.8C) or below.  Foods are no longer safe to eat when they have been between the temperatures of 40-159F (4.4-60C) for more than 2 hours. What foods should I eat? Fruits Aim to eat 2 cup-equivalents of fresh, canned (in natural juice), or frozen fruits each day. Examples of 1 cup-equivalent of fruit include 1 small apple, 8 large strawberries, 1 cup canned fruit,  cup  dried fruit, or 1 cup 100% juice. Vegetables Aim to eat 2-3 cup-equivalents of fresh and frozen vegetables each day, including different varieties and colors. Examples of 1 cup-equivalent of vegetables include 2 medium carrots, 2 cups raw, leafy greens, 1 cup chopped  vegetable (raw or cooked), or 1 medium baked potato. Grains Aim to eat 6 ounce-equivalents of whole grains each day. Examples of 1 ounce-equivalent of grains include 1 slice of bread, 1 cup ready-to-eat cereal, 3 cups popcorn, or  cup cooked rice, pasta, or cereal. Meats and other proteins Aim to eat 5-6 ounce-equivalents of protein each day. Examples of 1 ounce-equivalent of protein include 1 egg, 1/2 cup nuts or seeds, or 1 tablespoon (16 g) peanut butter. A cut of meat or fish that is the size of a deck of cards is about 3-4 ounce-equivalents.  Of the protein you eat each week, try to have at least 8 ounces come from seafood. This includes salmon, trout, herring, and anchovies. Dairy Aim to eat 3 cup-equivalents of fat-free or low-fat dairy each day. Examples of 1 cup-equivalent of dairy include 1 cup (240 mL) milk, 8 ounces (250 g) yogurt, 1 ounces (44 g) natural cheese, or 1 cup (240 mL) fortified soy milk. Fats and oils  Aim for about 5 teaspoons (21 g) per day. Choose monounsaturated fats, such as canola and olive oils, avocados, peanut butter, and most nuts, or polyunsaturated fats, such as sunflower, corn, and soybean oils, walnuts, pine nuts, sesame seeds, sunflower seeds, and flaxseed. Beverages  Aim for six 8-oz glasses of water per day. Limit coffee to three to five 8-oz cups per day.  Limit caffeinated beverages that have added calories, such as soda and energy drinks.  Limit alcohol intake to no more than 1 drink a day for nonpregnant women and 2 drinks a day for men. One drink equals 12 oz of beer (355 mL), 5 oz of wine (148 mL), or 1 oz of hard liquor (44 mL). Seasoning and other foods  Avoid adding excess amounts of salt to your foods. Try flavoring foods with herbs and spices instead of salt.  Avoid adding sugar to foods.  Try using oil-based dressings, sauces, and spreads instead of solid fats. This information is based on general [place] nutrition guidelines. For more  information, visit [employer]. Exact amounts may vary based on your nutrition needs. Summary  A healthy eating plan may help you to maintain a healthy weight, reduce the risk of chronic diseases, and stay active throughout your life.  Plan your meals. Make sure you eat the right portions of a variety of nutrient-rich foods.  Try baking, boiling, grilling, or broiling instead of frying.  Choose healthy options in all settings, including home, work, school, restaurants, or stores. This information is not intended to replace advice given to you by your health care provider. Make sure you discuss any questions you have with your health care provider. Document Revised: 08/06/2017 Document Reviewed: 08/06/2017 Elsevier Patient Education  [place].

## 2019-11-07 NOTE — Assessment & Plan Note (Signed)
Chronic, ongoing.  BP at goal on average on home readings, occasional elevations noted before taking medication, and BP at goal in office.  Continue current medication regimen and adjust as needed.  Continue to check BP at home on occasion and focus on healthy DASH diet and regular exercise.  Return in 6 months.

## 2019-11-07 NOTE — Progress Notes (Signed)
BP 107/67 (BP Location: Left Arm, Cuff Size: Normal)   Pulse 78   Temp 97.7 F (36.5 C) (Oral)   Ht 5' 6.1" (1.679 m)   Wt 150 lb 9.6 oz (68.3 kg)   SpO2 98%   BMI 24.23 kg/m    Subjective:    Patient ID: ILYA UNANGST, male    DOB: 02-Mar-1956, 64 y.o.   MRN: [id number]  HPI: Warren Richardson is a 64 y.o. male presenting on 11/07/2019 for comprehensive medical examination. Current medical complaints include:none  He currently lives with: self Interim Problems from his last visit: no   HYPERTENSION / HYPERLIPIDEMIA Continues on Lisinopril-HCTZ and Pravastatin + ASA. Satisfied with current treatment?yes Duration of hypertension:chronic BP monitoring frequency:rarely BP range: on average <130/80 BP medication side effects:no Duration of hyperlipidemia:chronic Cholesterol medication side effects:no Cholesterol supplements: none Medication compliance:good compliance Aspirin:yes Recent stressors:no Recurrent headaches:no Visual changes:no Palpitations:no Dyspnea:no Chest pain:no Lower extremity edema:no Dizzy/lightheaded:no The 10-year ASCVD risk score Denman George DC Jr., et al., 2013) is: 8%   Values used to calculate the score:     Age: 19 years     Sex: Male     Is Non-Hispanic African American: No     Diabetic: No     Tobacco smoker: No     Systolic Blood Pressure: 107 mmHg     Is BP treated: Yes     HDL Cholesterol: 76 mg/dL     Total Cholesterol: 203 mg/dL   Functional Status Survey: Is the patient deaf or have difficulty hearing?: No Does the patient have difficulty seeing, even when wearing glasses/contacts?: No Does the patient have difficulty concentrating, remembering, or making decisions?: No Does the patient have difficulty walking or climbing stairs?: No Does the patient have difficulty dressing or bathing?: No Does the patient have difficulty doing errands alone such as visiting a doctor's office or shopping?: No  FALL RISK: Fall  Risk  11/07/2019 11/05/2018 12/24/2017 07/27/2017 07/11/2016  Falls in the past year? 0 1 No No No  Number falls in past yr: 0 0 - - -  Injury with Fall? 0 0 - - -  Follow up Falls evaluation completed Falls evaluation completed - - -    Depression Screen Depression screen [facility] 2/9 11/07/2019 11/05/2018 12/24/2017 07/27/2017 07/11/2016  Decreased Interest 0 0 0 0 0  Down, Depressed, Hopeless 0 1 0 0 0  PHQ - 2 Score 0 1 0 0 0  Altered sleeping - 1 - - -  Tired, decreased energy - 0 - - -  Change in appetite - 0 - - -  Feeling bad or failure about yourself  - 0 - - -  Trouble concentrating - 0 - - -  Moving slowly or fidgety/restless - 0 - - -  Suicidal thoughts - 0 - - -  PHQ-9 Score - 2 - - -  Difficult doing work/chores - Not difficult at all - - -    Advanced Directives <no information>  Past Medical History:  Past Medical History:  Diagnosis Date  . Hyperlipidemia   . Hypertension     Surgical History:  History reviewed. No pertinent surgical history.  Medications:  Current Outpatient Medications on File Prior to Visit  Medication Sig  . aspirin 81 MG chewable tablet Chew 81 mg by mouth daily.  Marland Kitchen BIOTIN PO Take 250 mg by mouth daily.  . Flavoring Agent (BLACKBERRY FLAVOR) LIQD Take by mouth.  . Ginger, Zingiber officinalis, (GINGER ROOT PO) 1  Dose once daily  . glucosamine-chondroitin 500-400 MG tablet Take 1 tablet by mouth 2 (two) times daily.  Marland Kitchen lisinopril-hydrochlorothiazide (ZESTORETIC) 20-25 MG tablet Take 1 tablet by mouth daily.  . Multiple Vitamin (MULTI-VITAMINS) TABS Take 1 tablet by mouth daily.  . Omega-3 Fatty Acids (FISH OIL) 1000 MG CAPS Take 1,000 mg by mouth 2 (two) times daily.  . pravastatin (PRAVACHOL) 40 MG tablet TAKE 1 TABLET BY MOUTH EVERY DAY  . Saw Palmetto, Serenoa repens, (SAW PALMETTO PO) Take by mouth.   No current facility-administered medications on file prior to visit.    Allergies:  Allergies  Allergen Reactions  . Atorvastatin Other  (See Comments)    Increased liver enzymes    Social History:  Social History   Socioeconomic History  . Marital status: Single    Spouse name: Not on file  . Number of children: Not on file  . Years of education: Not on file  . Highest education level: Not on file  Occupational History  . Not on file  Tobacco Use  . Smoking status: Never Smoker  . Smokeless tobacco: Never Used  Vaping Use  . Vaping Use: Never used  Substance and Sexual Activity  . Alcohol use: Yes    Alcohol/week: 1.0 - 2.0 standard drink    Types: 1 - 2 Cans of beer per week  . Drug use: No  . Sexual activity: Yes  Other Topics Concern  . Not on file  Social History Narrative  . Not on file   Social Determinants of Health   Financial Resource Strain:   . Difficulty of Paying Living Expenses:   Food Insecurity:   . Worried About Programme researcher, broadcasting/film/video in the Last Year:   . Barista in the Last Year:   Transportation Needs:   . Freight forwarder (Medical):   Marland Kitchen Lack of Transportation (Non-Medical):   Physical Activity:   . Days of Exercise per Week:   . Minutes of Exercise per Session:   Stress:   . Feeling of Stress :   Social Connections:   . Frequency of Communication with Friends and Family:   . Frequency of Social Gatherings with Friends and Family:   . Attends Religious Services:   . Active Member of Clubs or Organizations:   . Attends Banker Meetings:   Marland Kitchen Marital Status:   Intimate Partner Violence:   . Fear of Current or Ex-Partner:   . Emotionally Abused:   Marland Kitchen Physically Abused:   . Sexually Abused:    Social History   Tobacco Use  Smoking Status Never Smoker  Smokeless Tobacco Never Used   Social History   Substance and Sexual Activity  Alcohol Use Yes  . Alcohol/week: 1.0 - 2.0 standard drink  . Types: 1 - 2 Cans of beer per week    Family History:  Family History  Problem Relation Age of Onset  . Arthritis Mother   . Stroke Mother   .  Dementia Mother   . Cancer Father        prostate  . Diabetes Father   . Hyperlipidemia Father   . Hypertension Father   . Cancer Sister        breast    Past medical history, surgical history, medications, allergies, family history and social history reviewed with patient today and changes made to appropriate areas of the chart.   Review of Systems - negative All other ROS negative except what is  listed above and in the HPI.      Objective:    BP 107/67 (BP Location: Left Arm, Cuff Size: Normal)   Pulse 78   Temp 97.7 F (36.5 C) (Oral)   Ht 5' 6.1" (1.679 m)   Wt 150 lb 9.6 oz (68.3 kg)   SpO2 98%   BMI 24.23 kg/m   Wt Readings from Last 3 Encounters:  11/07/19 150 lb 9.6 oz (68.3 kg)  09/19/19 154 lb (69.9 kg)  11/05/18 146 lb 3.2 oz (66.3 kg)    Physical Exam Vitals and nursing note reviewed.  Constitutional:      General: He is awake. He is not in acute distress.    Appearance: He is well-developed and well-groomed. He is not ill-appearing.  HENT:     Head: Normocephalic and atraumatic.     Right Ear: Hearing, tympanic membrane, ear canal and external ear normal. No drainage.     Left Ear: Hearing, tympanic membrane, ear canal and external ear normal. No drainage.     Nose: Nose normal.     Mouth/Throat:     Pharynx: Uvula midline.  Eyes:     General: Lids are normal.        Right eye: No discharge.        Left eye: No discharge.     Extraocular Movements: Extraocular movements intact.     Conjunctiva/sclera: Conjunctivae normal.     Pupils: Pupils are equal, round, and reactive to light.     Visual Fields: Right eye visual fields normal and left eye visual fields normal.  Neck:     Thyroid: No thyromegaly.     Vascular: No carotid bruit or JVD.     Trachea: Trachea normal.  Cardiovascular:     Rate and Rhythm: Normal rate and regular rhythm.     Heart sounds: Normal heart sounds, S1 normal and S2 normal. No murmur heard.  No gallop.   Pulmonary:      Effort: Pulmonary effort is normal. No accessory muscle usage or respiratory distress.     Breath sounds: Normal breath sounds.  Abdominal:     General: Bowel sounds are normal.     Palpations: Abdomen is soft. There is no hepatomegaly or splenomegaly.     Tenderness: There is no abdominal tenderness.  Musculoskeletal:        General: Normal range of motion.     Cervical back: Normal range of motion and neck supple.     Right lower leg: No edema.     Left lower leg: No edema.  Lymphadenopathy:     Head:     Right side of head: No submental, submandibular, tonsillar, preauricular or posterior auricular adenopathy.     Left side of head: No submental, submandibular, tonsillar, preauricular or posterior auricular adenopathy.     Cervical: No cervical adenopathy.  Skin:    General: Skin is warm and dry.     Capillary Refill: Capillary refill takes less than 2 seconds.     Findings: No rash.  Neurological:     Mental Status: He is alert and oriented to person, place, and time.     Cranial Nerves: Cranial nerves are intact.     Gait: Gait is intact.     Deep Tendon Reflexes: Reflexes are normal and symmetric.     Reflex Scores:      Brachioradialis reflexes are 2+ on the right side and 2+ on the left side.      Patellar  reflexes are 2+ on the right side and 2+ on the left side. Psychiatric:        Attention and Perception: Attention normal.        Mood and Affect: Mood normal.        Speech: Speech normal.        Behavior: Behavior normal. Behavior is cooperative.        Thought Content: Thought content normal.        Cognition and Memory: Cognition normal.        Judgment: Judgment normal.      Results for orders placed or performed in visit on 11/05/18  CBC with Differential/Platelet  Result Value Ref Range   WBC 4.2 3.4 - 10.8 x10E3/uL   RBC 4.00 (L) 4.14 - 5.80 x10E6/uL   Hemoglobin 13.8 13.0 - 17.7 g/dL   Hematocrit 27.2 53.6 - 51.0 %   MCV 96 79 - 97 fL   MCH 34.5 (H)  26.6 - 33.0 pg   MCHC 35.9 (H) 31 - 35 g/dL   RDW 64.4 03.4 - 74.2 %   Platelets 199 150 - 450 x10E3/uL   Neutrophils 70 Not Estab. %   Lymphs 16 Not Estab. %   Monocytes 11 Not Estab. %   Eos 1 Not Estab. %   Basos 1 Not Estab. %   Neutrophils Absolute 3.0 1 - 7 x10E3/uL   Lymphocytes Absolute 0.7 0 - 3 x10E3/uL   Monocytes Absolute 0.5 0 - 0 x10E3/uL   EOS (ABSOLUTE) 0.0 0.0 - 0.4 x10E3/uL   Basophils Absolute 0.0 0 - 0 x10E3/uL   Immature Granulocytes 1 Not Estab. %   Immature Grans (Abs) 0.0 0.0 - 0.1 x10E3/uL  Comprehensive metabolic panel  Result Value Ref Range   Glucose 109 (H) 65 - 99 mg/dL   BUN 21 8 - 27 mg/dL   Creatinine, Ser 5.95 0.76 - 1.27 mg/dL   GFR calc non Af Amer 71 >59 mL/min/1.73   GFR calc Af Amer 82 >59 mL/min/1.73   BUN/Creatinine Ratio 19 10 - 24   Sodium 138 134 - 144 mmol/L   Potassium 4.2 3.5 - 5.2 mmol/L   Chloride 96 96 - 106 mmol/L   CO2 22 20 - 29 mmol/L   Calcium 9.3 8.6 - 10.2 mg/dL   Total Protein 7.1 6.0 - 8.5 g/dL   Albumin 5.0 (H) 3.8 - 4.8 g/dL   Globulin, Total 2.1 1.5 - 4.5 g/dL   Albumin/Globulin Ratio 2.4 (H) 1.2 - 2.2   Bilirubin Total 0.4 0.0 - 1.2 mg/dL   Alkaline Phosphatase 67 39 - 117 IU/L   AST 29 0 - 40 IU/L   ALT 12 0 - 44 IU/L  PSA  Result Value Ref Range   Prostate Specific Ag, Serum 3.1 0.0 - 4.0 ng/mL  TSH  Result Value Ref Range   TSH 1.610 0.450 - 4.500 uIU/mL  NMR, lipoprofile([facility])  Result Value Ref Range   LDL Particle Number 1,336 (H) <1,000 nmol/L   LDL-C 119 (H) 0 - 99 mg/dL   HDL-C 66 >63 mg/dL   Triglycerides 90 0 - 149 mg/dL   Cholesterol, Total 875 (H) 100 - 199 mg/dL   HDL Particle Number [id number] >=30.5 umol/L   Small LDL Particle Number 602 (H) <=527 nmol/L   LDL Size 21.0 >20.5 nm   LP-IR Score 39 <=45      Assessment & Plan:   Problem List Items Addressed This Visit  Cardiovascular and Mediastinum   Benign essential hypertension    Chronic, ongoing.  BP at goal on  average on home readings, occasional elevations noted before taking medication, and BP at goal in office.  Continue current medication regimen and adjust as needed.  Continue to check BP at home on occasion and focus on healthy DASH diet and regular exercise.  Return in 6 months.      Relevant Orders   CBC with Differential/Platelet   Comprehensive metabolic panel   TSH     Other   Mixed hyperlipidemia    Chronic, stable.  Continue current medication regimen and adjust as needed. Lipid panel today.  Return in 6 months.      Relevant Orders   Lipid Panel w/o Chol/HDL Ratio    Other Visit Diagnoses    PE (physical exam), annual    -  Primary   Annual physical labs today to include CBC, CMP, TSH, lipid, PSA   Prostate cancer screening       PSA on labs today   Relevant Orders   PSA      Discussed aspirin prophylaxis for myocardial infarction prevention and decision was made to continue ASA  LABORATORY TESTING:  Health maintenance labs ordered today as discussed above.   The natural history of prostate cancer and ongoing controversy regarding screening and potential treatment outcomes of prostate cancer has been discussed with the patient. The meaning of a false positive PSA and a false negative PSA has been discussed. He indicates understanding of the limitations of this screening test and wishes  to proceed with screening PSA testing.   IMMUNIZATIONS:   - Tdap: Tetanus vaccination status reviewed: last tetanus booster within 10 years. - Influenza: Up to date - Pneumovax: Not applicable - Prevnar: Not applicable - Zostavax vaccine: to check with insurance  SCREENING: - Colonoscopy: Up to date  Discussed with patient purpose of the colonoscopy is to detect colon cancer at curable precancerous or early stages   - AAA Screening: Not applicable  -Hearing Test: Not applicable  -Spirometry: Not applicable   PATIENT COUNSELING:    Sexuality: Discussed sexually transmitted  diseases, partner selection, use of condoms, avoidance of unintended pregnancy  and contraceptive alternatives.   Advised to avoid cigarette smoking.  I discussed with the patient that most people either abstain from alcohol or drink within safe limits (<=14/week and <=4 drinks/occasion for males, <=7/weeks and <= 3 drinks/occasion for females) and that the risk for alcohol disorders and other health effects rises proportionally with the number of drinks per week and how often a drinker exceeds daily limits.  Discussed cessation/primary prevention of drug use and availability of treatment for abuse.   Diet: Encouraged to adjust caloric intake to maintain  or achieve ideal body weight, to reduce intake of dietary saturated fat and total fat, to limit sodium intake by avoiding high sodium foods and not adding table salt, and to maintain adequate dietary potassium and calcium preferably from fresh fruits, vegetables, and low-fat dairy products.    stressed the importance of regular exercise  Injury prevention: Discussed safety belts, safety helmets, smoke detector, smoking near bedding or upholstery.   Dental health: Discussed importance of regular tooth brushing, flossing, and dental visits.   Follow up plan: NEXT PREVENTATIVE PHYSICAL DUE IN 1 YEAR. Return in about 6 months (around 05/09/2020) for HTN/HLD.

## 2019-11-08 ENCOUNTER — Other Ambulatory Visit: Payer: Self-pay | Admitting: Nurse Practitioner

## 2019-11-08 DIAGNOSIS — D649 Anemia, unspecified: Secondary | ICD-10-CM

## 2019-11-08 LAB — COMPREHENSIVE METABOLIC PANEL
ALT: 10 IU/L (ref 0–44)
AST: 27 IU/L (ref 0–40)
Albumin/Globulin Ratio: 2.4 — ABNORMAL HIGH (ref 1.2–2.2)
Albumin: 4.5 g/dL (ref 3.8–4.8)
Alkaline Phosphatase: 54 IU/L (ref 48–121)
BUN/Creatinine Ratio: 16 (ref 10–24)
BUN: 16 mg/dL (ref 8–27)
Bilirubin Total: 0.5 mg/dL (ref 0.0–1.2)
CO2: 24 mmol/L (ref 20–29)
Calcium: 9.4 mg/dL (ref 8.6–10.2)
Chloride: 99 mmol/L (ref 96–106)
Creatinine, Ser: 1 mg/dL (ref 0.76–1.27)
GFR calc Af Amer: 92 mL/min/{1.73_m2} (ref >59)
GFR calc non Af Amer: 79 mL/min/{1.73_m2} (ref >59)
Globulin, Total: 1.9 g/dL (ref 1.5–4.5)
Glucose: 84 mg/dL (ref 65–99)
Potassium: 4.1 mmol/L (ref 3.5–5.2)
Sodium: 137 mmol/L (ref 134–144)
Total Protein: 6.4 g/dL (ref 6.0–8.5)

## 2019-11-08 LAB — CBC WITH DIFFERENTIAL/PLATELET
Basophils Absolute: 0 10*3/uL (ref 0.0–0.2)
Basos: 1 %
EOS (ABSOLUTE): 0.1 10*3/uL (ref 0.0–0.4)
Eos: 2 %
Hematocrit: 38.7 % (ref 37.5–51.0)
Hemoglobin: 12.9 g/dL — ABNORMAL LOW (ref 13.0–17.7)
Immature Grans (Abs): 0 10*3/uL (ref 0.0–0.1)
Immature Granulocytes: 0 %
Lymphocytes Absolute: 1.2 10*3/uL (ref 0.7–3.1)
Lymphs: 35 %
MCH: 32.8 pg (ref 26.6–33.0)
MCHC: 33.3 g/dL (ref 31.5–35.7)
MCV: 99 fL — ABNORMAL HIGH (ref 79–97)
Monocytes Absolute: 0.4 10*3/uL (ref 0.1–0.9)
Monocytes: 11 %
Neutrophils Absolute: 1.8 10*3/uL (ref 1.4–7.0)
Neutrophils: 51 %
Platelets: 217 10*3/uL (ref 150–450)
RBC: 3.93 x10E6/uL — ABNORMAL LOW (ref 4.14–5.80)
RDW: 13 % (ref 11.6–15.4)
WBC: 3.4 10*3/uL (ref 3.4–10.8)

## 2019-11-08 LAB — LIPID PANEL W/O CHOL/HDL RATIO
Cholesterol, Total: 213 mg/dL — ABNORMAL HIGH (ref 100–199)
HDL: 59 mg/dL (ref >39)
LDL Chol Calc (NIH): 135 mg/dL — ABNORMAL HIGH (ref 0–99)
Triglycerides: 108 mg/dL (ref 0–149)
VLDL Cholesterol Cal: 19 mg/dL (ref 5–40)

## 2019-11-08 LAB — TSH: TSH: 1.39 u[IU]/mL (ref 0.450–4.500)

## 2019-11-08 LAB — PSA: Prostate Specific Ag, Serum: 3.6 ng/mL (ref 0.0–4.0)

## 2019-11-08 NOTE — Progress Notes (Signed)
Contacted via MyChartGood afternoon Warren Richardson, your labs have returned: - Kidney, liver, prostate, and thyroid levels are normal - CBC shows a slightly low hemoglobin at 12.9 -- I would like to recheck this via outpatient labs only in 6 weeks.  Can you please scheduled a lab only visit.  Want to ensure no anemia is presenting.  Have you had any blood in stool? - Cholesterol levels continue to be elevated with LDL above goal at 135.  Goal is 70 or less for stroke prevention.  Are you taking Pravastatin every day?  If so we can either increase this dose to 80 MG OR we can change to a different statin like Rosuvastatin which may be a bit stronger.  Thoughts? Let me know if any questions. Keep being awesome!! Kindest regards, Gracemarie Skeet

## 2019-11-17 DIAGNOSIS — I071 Rheumatic tricuspid insufficiency: Secondary | ICD-10-CM | POA: Insufficient documentation

## 2019-11-17 DIAGNOSIS — I340 Nonrheumatic mitral (valve) insufficiency: Secondary | ICD-10-CM | POA: Insufficient documentation

## 2020-02-20 ENCOUNTER — Other Ambulatory Visit: Payer: Self-pay | Admitting: Nurse Practitioner

## 2020-02-24 ENCOUNTER — Other Ambulatory Visit: Payer: Self-pay | Admitting: Nurse Practitioner

## 2020-05-08 ENCOUNTER — Encounter: Payer: Self-pay | Admitting: Nurse Practitioner

## 2020-05-11 ENCOUNTER — Encounter: Payer: Self-pay | Admitting: Nurse Practitioner

## 2020-05-11 ENCOUNTER — Other Ambulatory Visit: Payer: Self-pay

## 2020-05-11 ENCOUNTER — Ambulatory Visit (INDEPENDENT_AMBULATORY_CARE_PROVIDER_SITE_OTHER): Payer: Managed Care, Other (non HMO) | Admitting: Nurse Practitioner

## 2020-05-11 VITALS — BP 132/82 | HR 81 | Temp 97.8°F | Ht 66.14 in | Wt 156.2 lb

## 2020-05-11 DIAGNOSIS — D649 Anemia, unspecified: Secondary | ICD-10-CM | POA: Diagnosis not present

## 2020-05-11 DIAGNOSIS — E782 Mixed hyperlipidemia: Secondary | ICD-10-CM | POA: Diagnosis not present

## 2020-05-11 DIAGNOSIS — I10 Essential (primary) hypertension: Secondary | ICD-10-CM

## 2020-05-11 MED ORDER — LISINOPRIL-HYDROCHLOROTHIAZIDE 20-25 MG PO TABS: 1.0000 | ORAL_TABLET | Freq: Every day | ORAL | 4 refills | Status: DC

## 2020-05-11 MED ORDER — PRAVASTATIN SODIUM 40 MG PO TABS: 40.0000 mg | ORAL_TABLET | Freq: Every day | ORAL | 4 refills | Status: DC

## 2020-05-11 NOTE — Assessment & Plan Note (Signed)
Chronic, stable.  Continue current medication regimen and adjust as needed.  Lipid panel today.  Return in 6 months. 

## 2020-05-11 NOTE — Assessment & Plan Note (Signed)
Recheck today, noted on physical labs in July, and check iron levels.

## 2020-05-11 NOTE — Progress Notes (Signed)
BP 132/82   Pulse 81   Temp 97.8 F (36.6 C) (Oral)   Ht 5' 6.14" (1.68 m)   Wt 156 lb 3.2 oz (70.9 kg)   SpO2 98%   BMI 25.10 kg/m    Subjective:    Patient ID: Warren Richardson, male    DOB: May 27, 1955, 65 y.o.   MRN: [id number]  HPI: Warren Richardson is a 65 y.o. male  Chief Complaint  Patient presents with  . Hypertension   HYPERTENSION / HYPERLIPIDEMIA Continues on Lisinopril-HCTZ, ASA, Pravastatin.  Last saw cardiology on 11/17/19, Dr. Freida Busman at [facility]. Mild anemia noted on labs 11/07/19 -- H/H 12.9/38.7, MCV 99 -- was to repeat but did not come in for labs. Satisfied with current treatment? yes Duration of hypertension: chronic BP monitoring frequency: rarely BP range: 121/82 this morning BP medication side effects: no Duration of hyperlipidemia: chronic Cholesterol medication side effects: no Cholesterol supplements: fish oil Medication compliance: good compliance Aspirin: yes Recent stressors: no Recurrent headaches: no Visual changes: no Palpitations: no Dyspnea: no Chest pain: no Lower extremity edema: no Dizzy/lightheaded: no  Relevant past medical, surgical, family and social history reviewed and updated as indicated. Interim medical history since our last visit reviewed. Allergies and medications reviewed and updated.  Review of Systems  Constitutional: Negative for activity change, diaphoresis, fatigue and fever.  Respiratory: Negative for cough, chest tightness, shortness of breath and wheezing.   Cardiovascular: Negative for chest pain, palpitations and leg swelling.  Gastrointestinal: Negative.   Endocrine: Negative for cold intolerance and heat intolerance.  Neurological: Negative.   Psychiatric/Behavioral: Negative.     Per HPI unless specifically indicated above     Objective:    BP 132/82   Pulse 81   Temp 97.8 F (36.6 C) (Oral)   Ht 5' 6.14" (1.68 m)   Wt 156 lb 3.2 oz (70.9 kg)   SpO2 98%   BMI 25.10 kg/m   Wt Readings from  Last 3 Encounters:  05/11/20 156 lb 3.2 oz (70.9 kg)  11/07/19 150 lb 9.6 oz (68.3 kg)  09/19/19 154 lb (69.9 kg)    Physical Exam Vitals and nursing note reviewed.  Constitutional:      General: He is awake. He is not in acute distress.    Appearance: He is well-developed and well-groomed. He is not ill-appearing.  HENT:     Head: Normocephalic and atraumatic.     Right Ear: Hearing normal. No drainage.     Left Ear: Hearing normal. No drainage.  Eyes:     General: Lids are normal.        Right eye: No discharge.        Left eye: No discharge.     Conjunctiva/sclera: Conjunctivae normal.     Pupils: Pupils are equal, round, and reactive to light.  Neck:     Thyroid: No thyromegaly.     Vascular: No carotid bruit or JVD.     Trachea: Trachea normal.  Cardiovascular:     Rate and Rhythm: Normal rate and regular rhythm.     Heart sounds: Normal heart sounds, S1 normal and S2 normal. No murmur heard. No gallop.   Pulmonary:     Effort: Pulmonary effort is normal.     Breath sounds: Normal breath sounds.  Abdominal:     General: Bowel sounds are normal.     Palpations: Abdomen is soft. There is no hepatomegaly or splenomegaly.  Musculoskeletal:        General:  Normal range of motion.     Cervical back: Normal range of motion and neck supple.     Right lower leg: No edema.     Left lower leg: No edema.  Skin:    General: Skin is warm and dry.     Capillary Refill: Capillary refill takes less than 2 seconds.     Findings: No rash.  Neurological:     Mental Status: He is alert and oriented to person, place, and time.     Deep Tendon Reflexes: Reflexes are normal and symmetric.     Reflex Scores:      Brachioradialis reflexes are 2+ on the right side and 2+ on the left side.      Patellar reflexes are 2+ on the right side and 2+ on the left side. Psychiatric:        Attention and Perception: Attention normal.        Mood and Affect: Mood normal.        Speech: Speech  normal.        Behavior: Behavior normal. Behavior is cooperative.        Thought Content: Thought content normal.    Results for orders placed or performed in visit on 11/07/19  CBC with Differential/Platelet  Result Value Ref Range   WBC 3.4 3.4 - 10.8 x10E3/uL   RBC 3.93 (L) 4.14 - 5.80 x10E6/uL   Hemoglobin 12.9 (L) 13.0 - 17.7 g/dL   Hematocrit 44.0 10.2 - 51.0 %   MCV 99 (H) 79 - 97 fL   MCH 32.8 26.6 - 33.0 pg   MCHC 33.3 31.5 - 35.7 g/dL   RDW 72.5 36.6 - 44.0 %   Platelets 217 150 - 450 x10E3/uL   Neutrophils 51 Not Estab. %   Lymphs 35 Not Estab. %   Monocytes 11 Not Estab. %   Eos 2 Not Estab. %   Basos 1 Not Estab. %   Neutrophils Absolute 1.8 1.4 - 7.0 x10E3/uL   Lymphocytes Absolute 1.2 0.7 - 3.1 x10E3/uL   Monocytes Absolute 0.4 0.1 - 0.9 x10E3/uL   EOS (ABSOLUTE) 0.1 0.0 - 0.4 x10E3/uL   Basophils Absolute 0.0 0.0 - 0.2 x10E3/uL   Immature Granulocytes 0 Not Estab. %   Immature Grans (Abs) 0.0 0.0 - 0.1 x10E3/uL  Comprehensive metabolic panel  Result Value Ref Range   Glucose 84 65 - 99 mg/dL   BUN 16 8 - 27 mg/dL   Creatinine, Ser 3.47 0.76 - 1.27 mg/dL   GFR calc non Af Amer 79 >59 mL/min/1.73   GFR calc Af Amer 92 >59 mL/min/1.73   BUN/Creatinine Ratio 16 10 - 24   Sodium 137 134 - 144 mmol/L   Potassium 4.1 3.5 - 5.2 mmol/L   Chloride 99 96 - 106 mmol/L   CO2 24 20 - 29 mmol/L   Calcium 9.4 8.6 - 10.2 mg/dL   Total Protein 6.4 6.0 - 8.5 g/dL   Albumin 4.5 3.8 - 4.8 g/dL   Globulin, Total 1.9 1.5 - 4.5 g/dL   Albumin/Globulin Ratio 2.4 (H) 1.2 - 2.2   Bilirubin Total 0.5 0.0 - 1.2 mg/dL   Alkaline Phosphatase 54 48 - 121 IU/L   AST 27 0 - 40 IU/L   ALT 10 0 - 44 IU/L  TSH  Result Value Ref Range   TSH 1.390 0.450 - 4.500 uIU/mL  PSA  Result Value Ref Range   Prostate Specific Ag, Serum 3.6 0.0 - 4.0 ng/mL  Lipid Panel w/o Chol/HDL Ratio  Result Value Ref Range   Cholesterol, Total 213 (H) 100 - 199 mg/dL   Triglycerides 409 0 - 149 mg/dL    HDL 59 >81 mg/dL   VLDL Cholesterol Cal 19 5 - 40 mg/dL   LDL Chol Calc (NIH) 191 (H) 0 - 99 mg/dL      Assessment & Plan:   Problem List Items Addressed This Visit      Cardiovascular and Mediastinum   Benign essential hypertension - Primary    Chronic, ongoing.  BP at goal on average on home readings and BP at goal in office.  Continue current medication regimen and adjust as needed.  Continue to check BP at home on occasion and focus on healthy DASH diet and regular exercise.  Return in 6 months for physical.  BMP and TSH today.      Relevant Medications   lisinopril-hydrochlorothiazide (ZESTORETIC) 20-25 MG tablet   pravastatin (PRAVACHOL) 40 MG tablet   Other Relevant Orders   TSH   Basic Metabolic Panel (BMET)     Other   Mixed hyperlipidemia    Chronic, stable.  Continue current medication regimen and adjust as needed. Lipid panel today.  Return in 6 months.      Relevant Medications   lisinopril-hydrochlorothiazide (ZESTORETIC) 20-25 MG tablet   pravastatin (PRAVACHOL) 40 MG tablet   Other Relevant Orders   Lipid Profile   Low hemoglobin    Recheck today, noted on physical labs in July, and check iron levels.      Relevant Orders   CBC with Differential/Platelet   Iron, TIBC and Ferritin Panel       Follow up plan: Return in about 6 months (around 11/08/2020) for Annual physical.

## 2020-05-11 NOTE — Patient Instructions (Signed)
DASH Eating Plan DASH stands for "Dietary Approaches to Stop Hypertension." The DASH eating plan is a healthy eating plan that has been shown to reduce high blood pressure (hypertension). It may also reduce your risk for type 2 diabetes, heart disease, and stroke. The DASH eating plan may also help with weight loss. What are tips for following this plan?  General guidelines  Avoid eating more than 2,300 mg (milligrams) of salt (sodium) a day. If you have hypertension, you may need to reduce your sodium intake to 1,500 mg a day.  Limit alcohol intake to no more than 1 drink a day for nonpregnant women and 2 drinks a day for men. One drink equals 12 oz of beer, 5 oz of wine, or 1 oz of hard liquor.  Work with your health care provider to maintain a healthy body weight or to lose weight. Ask what an ideal weight is for you.  Get at least 30 minutes of exercise that causes your heart to beat faster (aerobic exercise) most days of the week. Activities may include walking, swimming, or biking.  Work with your health care provider or diet and nutrition specialist (dietitian) to adjust your eating plan to your individual calorie needs. Reading food labels   Check food labels for the amount of sodium per serving. Choose foods with less than 5 percent of the Daily Value of sodium. Generally, foods with less than 300 mg of sodium per serving fit into this eating plan.  To find whole grains, look for the word "whole" as the first word in the ingredient list. Shopping  Buy products labeled as "low-sodium" or "no salt added."  Buy fresh foods. Avoid canned foods and premade or frozen meals. Cooking  Avoid adding salt when cooking. Use salt-free seasonings or herbs instead of table salt or sea salt. Check with your health care provider or pharmacist before using salt substitutes.  Do not fry foods. Cook foods using healthy methods such as baking, boiling, grilling, and broiling instead.  Cook with  heart-healthy oils, such as olive, canola, soybean, or sunflower oil. Meal planning  Eat a balanced diet that includes: ? 5 or more servings of fruits and vegetables each day. At each meal, try to fill half of your plate with fruits and vegetables. ? Up to 6-8 servings of whole grains each day. ? Less than 6 oz of lean meat, poultry, or fish each day. A 3-oz serving of meat is about the same size as a deck of cards. One egg equals 1 oz. ? 2 servings of low-fat dairy each day. ? A serving of nuts, seeds, or beans 5 times each week. ? Heart-healthy fats. Healthy fats called Omega-3 fatty acids are found in foods such as flaxseeds and coldwater fish, like sardines, salmon, and mackerel.  Limit how much you eat of the following: ? Canned or prepackaged foods. ? Food that is high in trans fat, such as fried foods. ? Food that is high in saturated fat, such as fatty meat. ? Sweets, desserts, sugary drinks, and other foods with added sugar. ? Full-fat dairy products.  Do not salt foods before eating.  Try to eat at least 2 vegetarian meals each week.  Eat more home-cooked food and less restaurant, buffet, and fast food.  When eating at a restaurant, ask that your food be prepared with less salt or no salt, if possible. What foods are recommended? The items listed may not be a complete list. Talk with your dietitian about   what dietary choices are best for you. Grains Whole-grain or whole-wheat bread. Whole-grain or whole-wheat pasta. Brown rice. Oatmeal. Quinoa. Bulgur. Whole-grain and low-sodium cereals. Pita bread. Low-fat, low-sodium crackers. Whole-wheat flour tortillas. Vegetables Fresh or frozen vegetables (raw, steamed, roasted, or grilled). Low-sodium or reduced-sodium tomato and vegetable juice. Low-sodium or reduced-sodium tomato sauce and tomato paste. Low-sodium or reduced-sodium canned vegetables. Fruits All fresh, dried, or frozen fruit. Canned fruit in natural juice (without  added sugar). Meat and other protein foods Skinless chicken or turkey. Ground chicken or turkey. Pork with fat trimmed off. Fish and seafood. Egg whites. Dried beans, peas, or lentils. Unsalted nuts, nut butters, and seeds. Unsalted canned beans. Lean cuts of beef with fat trimmed off. Low-sodium, lean deli meat. Dairy Low-fat (1%) or fat-free (skim) milk. Fat-free, low-fat, or reduced-fat cheeses. Nonfat, low-sodium ricotta or cottage cheese. Low-fat or nonfat yogurt. Low-fat, low-sodium cheese. Fats and oils Soft margarine without trans fats. Vegetable oil. Low-fat, reduced-fat, or light mayonnaise and salad dressings (reduced-sodium). Canola, safflower, olive, soybean, and sunflower oils. Avocado. Seasoning and other foods Herbs. Spices. Seasoning mixes without salt. Unsalted popcorn and pretzels. Fat-free sweets. What foods are not recommended? The items listed may not be a complete list. Talk with your dietitian about what dietary choices are best for you. Grains Baked goods made with fat, such as croissants, muffins, or some breads. Dry pasta or rice meal packs. Vegetables Creamed or fried vegetables. Vegetables in a cheese sauce. Regular canned vegetables (not low-sodium or reduced-sodium). Regular canned tomato sauce and paste (not low-sodium or reduced-sodium). Regular tomato and vegetable juice (not low-sodium or reduced-sodium). Pickles. Olives. Fruits Canned fruit in a light or heavy syrup. Fried fruit. Fruit in cream or butter sauce. Meat and other protein foods Fatty cuts of meat. Ribs. Fried meat. Bacon. Sausage. Bologna and other processed lunch meats. Salami. Fatback. Hotdogs. Bratwurst. Salted nuts and seeds. Canned beans with added salt. Canned or smoked fish. Whole eggs or egg yolks. Chicken or turkey with skin. Dairy Whole or 2% milk, cream, and half-and-half. Whole or full-fat cream cheese. Whole-fat or sweetened yogurt. Full-fat cheese. Nondairy creamers. Whipped toppings.  Processed cheese and cheese spreads. Fats and oils Butter. Stick margarine. Lard. Shortening. Ghee. Bacon fat. Tropical oils, such as coconut, palm kernel, or palm oil. Seasoning and other foods Salted popcorn and pretzels. Onion salt, garlic salt, seasoned salt, table salt, and sea salt. Worcestershire sauce. Tartar sauce. Barbecue sauce. Teriyaki sauce. Soy sauce, including reduced-sodium. Steak sauce. Canned and packaged gravies. Fish sauce. Oyster sauce. Cocktail sauce. Horseradish that you find on the shelf. Ketchup. Mustard. Meat flavorings and tenderizers. Bouillon cubes. Hot sauce and Tabasco sauce. Premade or packaged marinades. Premade or packaged taco seasonings. Relishes. Regular salad dressings. Where to find more information:  National Heart, Lung, and Blood Institute: www.nhlbi.nih.gov  American Heart Association: www.heart.org Summary  The DASH eating plan is a healthy eating plan that has been shown to reduce high blood pressure (hypertension). It may also reduce your risk for type 2 diabetes, heart disease, and stroke.  With the DASH eating plan, you should limit salt (sodium) intake to 2,300 mg a day. If you have hypertension, you may need to reduce your sodium intake to 1,500 mg a day.  When on the DASH eating plan, aim to eat more fresh fruits and vegetables, whole grains, lean proteins, low-fat dairy, and heart-healthy fats.  Work with your health care provider or diet and nutrition specialist (dietitian) to adjust your eating plan to your   individual calorie needs. This information is not intended to replace advice given to you by your health care provider. Make sure you discuss any questions you have with your health care provider. Document Revised: 04/06/2017 Document Reviewed: 04/17/2016 Elsevier Patient Education  2020 Elsevier Inc.  

## 2020-05-11 NOTE — Assessment & Plan Note (Signed)
Chronic, ongoing.  BP at goal on average on home readings and BP at goal in office.  Continue current medication regimen and adjust as needed.  Continue to check BP at home on occasion and focus on healthy DASH diet and regular exercise.  Return in 6 months for physical.  BMP and TSH today.

## 2020-05-12 ENCOUNTER — Other Ambulatory Visit: Payer: Self-pay | Admitting: Nurse Practitioner

## 2020-05-12 DIAGNOSIS — D649 Anemia, unspecified: Secondary | ICD-10-CM

## 2020-05-12 LAB — CBC WITH DIFFERENTIAL/PLATELET
Basophils Absolute: 0 10*3/uL (ref 0.0–0.2)
Basos: 1 %
EOS (ABSOLUTE): 0 10*3/uL (ref 0.0–0.4)
Eos: 1 %
Hematocrit: 37.4 % — ABNORMAL LOW (ref 37.5–51.0)
Hemoglobin: 12.8 g/dL — ABNORMAL LOW (ref 13.0–17.7)
Immature Grans (Abs): 0 10*3/uL (ref 0.0–0.1)
Immature Granulocytes: 1 %
Lymphocytes Absolute: 0.9 10*3/uL (ref 0.7–3.1)
Lymphs: 24 %
MCH: 33.7 pg — ABNORMAL HIGH (ref 26.6–33.0)
MCHC: 34.2 g/dL (ref 31.5–35.7)
MCV: 98 fL — ABNORMAL HIGH (ref 79–97)
Monocytes Absolute: 0.5 10*3/uL (ref 0.1–0.9)
Monocytes: 14 %
Neutrophils Absolute: 2.2 10*3/uL (ref 1.4–7.0)
Neutrophils: 59 %
Platelets: 243 10*3/uL (ref 150–450)
RBC: 3.8 x10E6/uL — ABNORMAL LOW (ref 4.14–5.80)
RDW: 12.5 % (ref 11.6–15.4)
WBC: 3.7 10*3/uL (ref 3.4–10.8)

## 2020-05-12 LAB — BASIC METABOLIC PANEL
BUN/Creatinine Ratio: 11 (ref 10–24)
BUN: 11 mg/dL (ref 8–27)
CO2: 24 mmol/L (ref 20–29)
Calcium: 9.9 mg/dL (ref 8.6–10.2)
Chloride: 97 mmol/L (ref 96–106)
Creatinine, Ser: 0.97 mg/dL (ref 0.76–1.27)
GFR calc Af Amer: 95 mL/min/{1.73_m2} (ref >59)
GFR calc non Af Amer: 82 mL/min/{1.73_m2} (ref >59)
Glucose: 78 mg/dL (ref 65–99)
Potassium: 4.7 mmol/L (ref 3.5–5.2)
Sodium: 136 mmol/L (ref 134–144)

## 2020-05-12 LAB — LIPID PANEL
Chol/HDL Ratio: 3.5 ratio (ref 0.0–5.0)
Cholesterol, Total: 231 mg/dL — ABNORMAL HIGH (ref 100–199)
HDL: 66 mg/dL (ref >39)
LDL Chol Calc (NIH): 130 mg/dL — ABNORMAL HIGH (ref 0–99)
Triglycerides: 202 mg/dL — ABNORMAL HIGH (ref 0–149)
VLDL Cholesterol Cal: 35 mg/dL (ref 5–40)

## 2020-05-12 LAB — IRON,TIBC AND FERRITIN PANEL
Ferritin: 240 ng/mL (ref 30–400)
Iron Saturation: 29 % (ref 15–55)
Iron: 98 ug/dL (ref 38–169)
Total Iron Binding Capacity: 340 ug/dL (ref 250–450)
UIBC: 242 ug/dL (ref 111–343)

## 2020-05-12 NOTE — Progress Notes (Signed)
Contacted via MyChart   Good evening Warren Richardson. Your labs have returned.  Kidney function remains stable.  Cholesterol levels mildly elevated, continue your Pravastatin daily.  CBC continues to show a lower hemoglobin and hematocrit, but normal iron levels.  Have you had any blood in stool, bright red or dark?  Any fatigue, shortness or breath, eating more ice?  Please let me know.  I would like to recheck this in 6 weeks via outpatient labs, please schedule lab visit.  Thank you!! Keep being awesome!!  Thank you for allowing me to participate in your care. Kindest regards, Avrom Robarts

## 2020-05-13 ENCOUNTER — Other Ambulatory Visit: Payer: Self-pay | Admitting: Nurse Practitioner

## 2020-05-13 DIAGNOSIS — D649 Anemia, unspecified: Secondary | ICD-10-CM

## 2020-06-10 ENCOUNTER — Other Ambulatory Visit: Payer: Self-pay

## 2020-06-10 ENCOUNTER — Other Ambulatory Visit: Payer: Managed Care, Other (non HMO)

## 2020-06-10 DIAGNOSIS — D649 Anemia, unspecified: Secondary | ICD-10-CM

## 2020-06-11 LAB — CBC WITH DIFFERENTIAL/PLATELET
Basophils Absolute: 0 10*3/uL (ref 0.0–0.2)
Basos: 1 %
EOS (ABSOLUTE): 0.1 10*3/uL (ref 0.0–0.4)
Eos: 1 %
Hematocrit: 35.5 % — ABNORMAL LOW (ref 37.5–51.0)
Hemoglobin: 12.4 g/dL — ABNORMAL LOW (ref 13.0–17.7)
Immature Grans (Abs): 0 10*3/uL (ref 0.0–0.1)
Immature Granulocytes: 0 %
Lymphocytes Absolute: 1.5 10*3/uL (ref 0.7–3.1)
Lymphs: 22 %
MCH: 33.9 pg — ABNORMAL HIGH (ref 26.6–33.0)
MCHC: 34.9 g/dL (ref 31.5–35.7)
MCV: 97 fL (ref 79–97)
Monocytes Absolute: 0.6 10*3/uL (ref 0.1–0.9)
Monocytes: 8 %
Neutrophils Absolute: 4.5 10*3/uL (ref 1.4–7.0)
Neutrophils: 68 %
Platelets: 296 10*3/uL (ref 150–450)
RBC: 3.66 x10E6/uL — ABNORMAL LOW (ref 4.14–5.80)
RDW: 12.5 % (ref 11.6–15.4)
WBC: 6.7 10*3/uL (ref 3.4–10.8)

## 2020-06-11 LAB — IRON,TIBC AND FERRITIN PANEL
Ferritin: 319 ng/mL (ref 30–400)
Iron Saturation: 38 % (ref 15–55)
Iron: 118 ug/dL (ref 38–169)
Total Iron Binding Capacity: 312 ug/dL (ref 250–450)
UIBC: 194 ug/dL (ref 111–343)

## 2020-06-11 LAB — SEDIMENTATION RATE: Sed Rate: 8 mm/hr (ref 0–30)

## 2020-06-11 LAB — C-REACTIVE PROTEIN: CRP: <1 mg/L (ref 0–10)

## 2020-06-11 LAB — VITAMIN B12: Vitamin B-12: 1508 pg/mL — ABNORMAL HIGH (ref 232–1245)

## 2020-06-11 NOTE — Progress Notes (Signed)
Contacted via MyChart  Good evening Warren Richardson, I heard about your lab experience and apologize.  I would be glad to assist in drawing next time.  As for your labs they have returned.  You are continuing to show some mild anemia, your hemoglobin and hematocrit have dropped just a little more.  However, your B12 level is normal, as is your iron and your inflammatory markers.  Did you take stool sample kit home with you?  We will see what this says once you return this and it is resulted.  My plan at this point is to wait on this result, if this is normal I feel we will need to recheck these labs again in 6 weeks, since they are slightly declining.  Do you donate blood?  Any recent bleeding you have noticed?  Fish oil and Aspirin can sometimes cause bleeding issues, so I recommend holding off on these for 6 weeks if possible.  Any questions? Keep being awesome!!  Thank you for allowing me to participate in your care. Kindest regards, Layn Kye

## 2020-06-14 ENCOUNTER — Other Ambulatory Visit: Payer: Self-pay

## 2020-06-14 ENCOUNTER — Other Ambulatory Visit: Payer: Managed Care, Other (non HMO)

## 2020-06-16 LAB — FECAL OCCULT BLOOD, IMMUNOCHEMICAL: Fecal Occult Bld: NEGATIVE

## 2020-06-16 NOTE — Progress Notes (Signed)
Contacted via MyChart   Good news, your stool sample returned with no blood.  This means less likely anemia coming from stomach area.  We will continue to monitor anemia closely and continue a daily vitamin and eating lots of green leafy vegetables.  Any questions?

## 2020-11-11 ENCOUNTER — Encounter: Payer: Managed Care, Other (non HMO) | Admitting: Nurse Practitioner

## 2020-11-12 ENCOUNTER — Other Ambulatory Visit: Payer: Self-pay

## 2020-11-12 ENCOUNTER — Ambulatory Visit (INDEPENDENT_AMBULATORY_CARE_PROVIDER_SITE_OTHER): Payer: Managed Care, Other (non HMO) | Admitting: Nurse Practitioner

## 2020-11-12 ENCOUNTER — Encounter: Payer: Self-pay | Admitting: Nurse Practitioner

## 2020-11-12 ENCOUNTER — Telehealth: Payer: Self-pay | Admitting: *Deleted

## 2020-11-12 VITALS — BP 125/70 | HR 71 | Temp 98.4°F | Ht 66.0 in | Wt 157.4 lb

## 2020-11-12 DIAGNOSIS — D539 Nutritional anemia, unspecified: Secondary | ICD-10-CM

## 2020-11-12 DIAGNOSIS — Z0000 Encounter for general adult medical examination without abnormal findings: Secondary | ICD-10-CM

## 2020-11-12 DIAGNOSIS — I10 Essential (primary) hypertension: Secondary | ICD-10-CM

## 2020-11-12 DIAGNOSIS — E782 Mixed hyperlipidemia: Secondary | ICD-10-CM | POA: Diagnosis not present

## 2020-11-12 DIAGNOSIS — D649 Anemia, unspecified: Secondary | ICD-10-CM

## 2020-11-12 NOTE — Telephone Encounter (Signed)
Pt. Refused to sign Annual form after I explained it to him. He stated he was not signing that.

## 2020-11-12 NOTE — Assessment & Plan Note (Signed)
Chronic, stable. Continue current regimen. Check CMP and CBC. Follow-up in 6 months.

## 2020-11-12 NOTE — Assessment & Plan Note (Signed)
Will recheck CBC today. Iron panel and vitamin B12 normal and hemoccult negative. Will check folate today. Treat and follow-up based on results.

## 2020-11-12 NOTE — Assessment & Plan Note (Signed)
Chronic. Last few LDL have not been at goal. Discussed increasing pravastatin to 80mg  daily. Will await lipid panel and adjust regimen as needed. Follow-up in 6 months.

## 2020-11-12 NOTE — Progress Notes (Signed)
BP 125/70   Pulse 71   Temp 98.4 F (36.9 C) (Oral)   Ht 5\' 6"  (1.676 m)   Wt 157 lb 6.4 oz (71.4 kg)   SpO2 99%   BMI 25.41 kg/m    Subjective:    Patient ID: Warren Richardson, male    DOB: Feb 26, 1956, 65 y.o.   MRN: [id number]  HPI: Warren Richardson is a 65 y.o. male presenting on 11/12/2020 for comprehensive medical examination. Current medical complaints include:none  HYPERTENSION / HYPERLIPIDEMIA  Satisfied with current treatment? yes Duration of hypertension: chronic BP monitoring frequency: a few times a week BP range: 110s/70s BP medication side effects: no Past BP meds: lisinopril-HCTZ Duration of hyperlipidemia: chronic Cholesterol medication side effects: no Cholesterol supplements: fish oil Past cholesterol medications: pravastatin (pravachol) Medication compliance: excellent compliance Aspirin: yes Recent stressors: no Recurrent headaches: no Visual changes: no Palpitations: no Dyspnea: no Chest pain: no Lower extremity edema: no Dizzy/lightheaded: no  He currently lives with: alone Interim Problems from his last visit: no  Depression Screen done today and results listed below:  Depression screen [facility] 2/9 11/12/2020 05/11/2020 11/07/2019 11/05/2018 12/24/2017  Decreased Interest 0 0 0 0 0  Down, Depressed, Hopeless 0 0 0 1 0  PHQ - 2 Score 0 0 0 1 0  Altered sleeping - - - 1 -  Tired, decreased energy - - - 0 -  Change in appetite - - - 0 -  Feeling bad or failure about yourself  - - - 0 -  Trouble concentrating - - - 0 -  Moving slowly or fidgety/restless - - - 0 -  Suicidal thoughts - - - 0 -  PHQ-9 Score - - - 2 -  Difficult doing work/chores - - - Not difficult at all -    The patient does not have a history of falls. I did not complete a risk assessment for falls. A plan of care for falls was not documented.   Past Medical History:  Past Medical History:  Diagnosis Date   Hyperlipidemia    Hypertension     Surgical History:  History  reviewed. No pertinent surgical history.  Medications:  Current Outpatient Medications on File Prior to Visit  Medication Sig   aspirin 81 MG chewable tablet Chew 81 mg by mouth daily.   BIOTIN PO Take 250 mg by mouth daily.   Flavoring Agent (BLACKBERRY FLAVOR) LIQD Take by mouth.   Ginger, Zingiber officinalis, (GINGER ROOT PO) 1 Dose once daily   glucosamine-chondroitin 500-400 MG tablet Take 1 tablet by mouth 2 (two) times daily.   lisinopril-hydrochlorothiazide (ZESTORETIC) 20-25 MG tablet Take 1 tablet by mouth daily.   Multiple Vitamin (MULTI-VITAMINS) TABS Take 1 tablet by mouth daily.   Omega-3 Fatty Acids (FISH OIL) 1000 MG CAPS Take 1,000 mg by mouth 2 (two) times daily.   pravastatin (PRAVACHOL) 40 MG tablet Take 1 tablet (40 mg total) by mouth daily.   Saw Palmetto, Serenoa repens, (SAW PALMETTO PO) Take by mouth.   No current facility-administered medications on file prior to visit.    Allergies:  Allergies  Allergen Reactions   Atorvastatin Other (See Comments)    Increased liver enzymes    Social History:  Social History   Socioeconomic History   Marital status: Single    Spouse name: Not on file   Number of children: Not on file   Years of education: Not on file   Highest education level: Not on file  Occupational History   Not on file  Tobacco Use   Smoking status: Never   Smokeless tobacco: Never  Vaping Use   Vaping Use: Never used  Substance and Sexual Activity   Alcohol use: Yes    Alcohol/week: 1.0 - 2.0 standard drink    Types: 1 - 2 Cans of beer per week   Drug use: No   Sexual activity: Yes  Other Topics Concern   Not on file  Social History Narrative   Not on file   Social Determinants of Health   Financial Resource Strain: Not on file  Food Insecurity: Not on file  Transportation Needs: Not on file  Physical Activity: Not on file  Stress: Not on file  Social Connections: Not on file  Intimate Partner Violence: Not on file    Social History   Tobacco Use  Smoking Status Never  Smokeless Tobacco Never   Social History   Substance and Sexual Activity  Alcohol Use Yes   Alcohol/week: 1.0 - 2.0 standard drink   Types: 1 - 2 Cans of beer per week    Family History:  Family History  Problem Relation Age of Onset   Arthritis Mother    Stroke Mother    Dementia Mother    Cancer Father        prostate   Diabetes Father    Hyperlipidemia Father    Hypertension Father    Cancer Sister        breast    Past medical history, surgical history, medications, allergies, family history and social history reviewed with patient today and changes made to appropriate areas of the chart.   Review of Systems  Constitutional: Negative.   HENT:  Positive for tinnitus. Negative for congestion, ear pain and sore throat.   Eyes: Negative.   Respiratory: Negative.    Cardiovascular: Negative.   Gastrointestinal: Negative.   Genitourinary: Negative.   Musculoskeletal: Negative.   Skin: Negative.   Neurological: Negative.   Endo/Heme/Allergies: Negative.   Psychiatric/Behavioral: Negative.    All other ROS negative except what is listed above and in the HPI.      Objective:    BP 125/70   Pulse 71   Temp 98.4 F (36.9 C) (Oral)   Ht 5\' 6"  (1.676 m)   Wt 157 lb 6.4 oz (71.4 kg)   SpO2 99%   BMI 25.41 kg/m   Wt Readings from Last 3 Encounters:  11/12/20 157 lb 6.4 oz (71.4 kg)  05/11/20 156 lb 3.2 oz (70.9 kg)  11/07/19 150 lb 9.6 oz (68.3 kg)    Physical Exam Vitals and nursing note reviewed.  Constitutional:      Appearance: Normal appearance.  HENT:     Head: Normocephalic and atraumatic.     Right Ear: Tympanic membrane, ear canal and external ear normal.     Left Ear: Tympanic membrane, ear canal and external ear normal.     Nose: Nose normal.     Mouth/Throat:     Mouth: Mucous membranes are moist.     Pharynx: Oropharynx is clear.  Eyes:     Conjunctiva/sclera: Conjunctivae normal.   Cardiovascular:     Rate and Rhythm: Normal rate and regular rhythm.     Pulses: Normal pulses.     Heart sounds: Normal heart sounds.  Pulmonary:     Effort: Pulmonary effort is normal.     Breath sounds: Normal breath sounds.  Abdominal:     General: Bowel sounds  are normal.     Palpations: Abdomen is soft.     Tenderness: There is no abdominal tenderness.  Musculoskeletal:        General: Normal range of motion.     Cervical back: Normal range of motion and neck supple.     Right lower leg: No edema.     Left lower leg: No edema.     Comments: Strength 5/5 in bilateral upper and lower extremities   Lymphadenopathy:     Cervical: No cervical adenopathy.  Skin:    General: Skin is warm and dry.  Neurological:     General: No focal deficit present.     Mental Status: He is alert and oriented to person, place, and time.     Cranial Nerves: No cranial nerve deficit.     Gait: Gait normal.     Deep Tendon Reflexes: Reflexes normal.  Psychiatric:        Mood and Affect: Mood normal.        Behavior: Behavior normal.        Thought Content: Thought content normal.        Judgment: Judgment normal.    Results for orders placed or performed in visit on 06/10/20  Fecal occult blood, imunochemical   Specimen: Stool   ST  Result Value Ref Range   Fecal Occult Bld Negative Negative  Sed Rate (ESR)  Result Value Ref Range   Sed Rate 8 0 - 30 mm/hr  C-reactive protein  Result Value Ref Range   CRP <1 0 - 10 mg/L  Iron, TIBC and Ferritin Panel  Result Value Ref Range   Total Iron Binding Capacity 312 250 - 450 ug/dL   UIBC 409 811 - 914 ug/dL   Iron 782 38 - 956 ug/dL   Iron Saturation 38 15 - 55 %   Ferritin 319 30 - 400 ng/mL  Vitamin B12  Result Value Ref Range   Vitamin B-12 1,508 (H) 232 - 1,245 pg/mL  CBC with Differential/Platelet  Result Value Ref Range   WBC 6.7 3.4 - 10.8 x10E3/uL   RBC 3.66 (L) 4.14 - 5.80 x10E6/uL   Hemoglobin 12.4 (L) 13.0 - 17.7 g/dL    Hematocrit 21.3 (L) 37.5 - 51.0 %   MCV 97 79 - 97 fL   MCH 33.9 (H) 26.6 - 33.0 pg   MCHC 34.9 31.5 - 35.7 g/dL   RDW 08.6 57.8 - 46.9 %   Platelets 296 150 - 450 x10E3/uL   Neutrophils 68 Not Estab. %   Lymphs 22 Not Estab. %   Monocytes 8 Not Estab. %   Eos 1 Not Estab. %   Basos 1 Not Estab. %   Neutrophils Absolute 4.5 1.4 - 7.0 x10E3/uL   Lymphocytes Absolute 1.5 0.7 - 3.1 x10E3/uL   Monocytes Absolute 0.6 0.1 - 0.9 x10E3/uL   EOS (ABSOLUTE) 0.1 0.0 - 0.4 x10E3/uL   Basophils Absolute 0.0 0.0 - 0.2 x10E3/uL   Immature Granulocytes 0 Not Estab. %   Immature Grans (Abs) 0.0 0.0 - 0.1 x10E3/uL      Assessment & Plan:   Problem List Items Addressed This Visit       Cardiovascular and Mediastinum   Benign essential hypertension - Primary    Chronic, stable. Continue current regimen. Check CMP and CBC. Follow-up in 6 months.       Relevant Orders   CBC with Differential/Platelet   Comprehensive metabolic panel     Other  Mixed hyperlipidemia    Chronic. Last few LDL have not been at goal. Discussed increasing pravastatin to 80mg  daily. Will await lipid panel and adjust regimen as needed. Follow-up in 6 months.        Relevant Orders   CBC with Differential/Platelet   Comprehensive metabolic panel   Lipid Panel w/o Chol/HDL Ratio   Low hemoglobin    Will recheck CBC today. Iron panel and vitamin B12 normal and hemoccult negative. Will check folate today. Treat and follow-up based on results.        Relevant Orders   Folate   Other Visit Diagnoses     Routine general medical examination at a health care facility       Health maintenance reviewed. Declined shingrix and pna vaccines. Check TSH, PSA.    Relevant Orders   TSH   PSA        Discussed aspirin prophylaxis for myocardial infarction prevention and decision was made to continue ASA  LABORATORY TESTING:  Health maintenance labs ordered today as discussed above.   The natural history of prostate  cancer and ongoing controversy regarding screening and potential treatment outcomes of prostate cancer has been discussed with the patient. The meaning of a false positive PSA and a false negative PSA has been discussed. He indicates understanding of the limitations of this screening test and wishes to proceed with screening PSA testing.   IMMUNIZATIONS:   - Tdap: Tetanus vaccination status reviewed: last tetanus booster within 10 years. - Influenza: Up to date - Pneumovax: Not applicable - Prevnar: Administered today - HPV: Not applicable - Zostavax vaccine: Refused  SCREENING: - Colonoscopy: Up to date  Discussed with patient purpose of the colonoscopy is to detect colon cancer at curable precancerous or early stages   - AAA Screening: Not applicable  -Hearing Test: Not applicable  -Spirometry: Not applicable   PATIENT COUNSELING:    Sexuality: Discussed sexually transmitted diseases, partner selection, use of condoms, avoidance of unintended pregnancy  and contraceptive alternatives.   Advised to avoid cigarette smoking.  I discussed with the patient that most people either abstain from alcohol or drink within safe limits (<=14/week and <=4 drinks/occasion for males, <=7/weeks and <= 3 drinks/occasion for females) and that the risk for alcohol disorders and other health effects rises proportionally with the number of drinks per week and how often a drinker exceeds daily limits.  Discussed cessation/primary prevention of drug use and availability of treatment for abuse.   Diet: Encouraged to adjust caloric intake to maintain  or achieve ideal body weight, to reduce intake of dietary saturated fat and total fat, to limit sodium intake by avoiding high sodium foods and not adding table salt, and to maintain adequate dietary potassium and calcium preferably from fresh fruits, vegetables, and low-fat dairy products.    stressed the importance of regular exercise  Injury prevention:  Discussed safety belts, safety helmets, smoke detector, smoking near bedding or upholstery.   Dental health: Discussed importance of regular tooth brushing, flossing, and dental visits.   Follow up plan: NEXT PREVENTATIVE PHYSICAL DUE IN 1 YEAR. Return in about 6 months (around 05/15/2021) for htn, hld.

## 2020-11-13 LAB — CBC WITH DIFFERENTIAL/PLATELET
Basophils Absolute: 0 10*3/uL (ref 0.0–0.2)
Basos: 1 %
EOS (ABSOLUTE): 0.1 10*3/uL (ref 0.0–0.4)
Eos: 1 %
Hematocrit: 35.5 % — ABNORMAL LOW (ref 37.5–51.0)
Hemoglobin: 12.1 g/dL — ABNORMAL LOW (ref 13.0–17.7)
Immature Grans (Abs): 0 10*3/uL (ref 0.0–0.1)
Immature Granulocytes: 0 %
Lymphocytes Absolute: 1.1 10*3/uL (ref 0.7–3.1)
Lymphs: 24 %
MCH: 33.7 pg — ABNORMAL HIGH (ref 26.6–33.0)
MCHC: 34.1 g/dL (ref 31.5–35.7)
MCV: 99 fL — ABNORMAL HIGH (ref 79–97)
Monocytes Absolute: 0.6 10*3/uL (ref 0.1–0.9)
Monocytes: 13 %
Neutrophils Absolute: 2.7 10*3/uL (ref 1.4–7.0)
Neutrophils: 61 %
Platelets: 179 10*3/uL (ref 150–450)
RBC: 3.59 x10E6/uL — ABNORMAL LOW (ref 4.14–5.80)
RDW: 12.8 % (ref 11.6–15.4)
WBC: 4.5 10*3/uL (ref 3.4–10.8)

## 2020-11-13 LAB — COMPREHENSIVE METABOLIC PANEL
ALT: 16 IU/L (ref 0–44)
AST: 42 IU/L — ABNORMAL HIGH (ref 0–40)
Albumin/Globulin Ratio: 2.4 — ABNORMAL HIGH (ref 1.2–2.2)
Albumin: 4.6 g/dL (ref 3.8–4.8)
Alkaline Phosphatase: 57 IU/L (ref 44–121)
BUN/Creatinine Ratio: 13 (ref 10–24)
BUN: 17 mg/dL (ref 8–27)
Bilirubin Total: 0.3 mg/dL (ref 0.0–1.2)
CO2: 20 mmol/L (ref 20–29)
Calcium: 9.2 mg/dL (ref 8.6–10.2)
Chloride: 95 mmol/L — ABNORMAL LOW (ref 96–106)
Creatinine, Ser: 1.27 mg/dL (ref 0.76–1.27)
Globulin, Total: 1.9 g/dL (ref 1.5–4.5)
Glucose: 96 mg/dL (ref 65–99)
Potassium: 4.1 mmol/L (ref 3.5–5.2)
Sodium: 132 mmol/L — ABNORMAL LOW (ref 134–144)
Total Protein: 6.5 g/dL (ref 6.0–8.5)
eGFR: 63 mL/min/{1.73_m2} (ref >59)

## 2020-11-13 LAB — LIPID PANEL W/O CHOL/HDL RATIO
Cholesterol, Total: 183 mg/dL (ref 100–199)
HDL: 56 mg/dL (ref >39)
LDL Chol Calc (NIH): 99 mg/dL (ref 0–99)
Triglycerides: 164 mg/dL — ABNORMAL HIGH (ref 0–149)
VLDL Cholesterol Cal: 28 mg/dL (ref 5–40)

## 2020-11-13 LAB — FOLATE: Folate: >20 ng/mL (ref >3.0)

## 2020-11-13 LAB — PSA: Prostate Specific Ag, Serum: 3.7 ng/mL (ref 0.0–4.0)

## 2020-11-13 LAB — TSH: TSH: 1.94 u[IU]/mL (ref 0.450–4.500)

## 2020-11-15 NOTE — Addendum Note (Signed)
Addended by: Rodman Pickle A on: 11/15/2020 06:18 PM   Modules accepted: Orders

## 2021-03-29 ENCOUNTER — Encounter: Payer: Self-pay | Admitting: Nurse Practitioner

## 2021-03-29 MED ORDER — PRAVASTATIN SODIUM 40 MG PO TABS: 40.0000 mg | ORAL_TABLET | Freq: Every day | ORAL | 1 refills | Status: DC

## 2021-04-25 ENCOUNTER — Encounter: Payer: Self-pay | Admitting: Nurse Practitioner

## 2021-05-16 ENCOUNTER — Ambulatory Visit (INDEPENDENT_AMBULATORY_CARE_PROVIDER_SITE_OTHER): Payer: Managed Care, Other (non HMO) | Admitting: Nurse Practitioner

## 2021-05-16 ENCOUNTER — Encounter: Payer: Self-pay | Admitting: Nurse Practitioner

## 2021-05-16 ENCOUNTER — Other Ambulatory Visit: Payer: Self-pay

## 2021-05-16 VITALS — BP 131/76 | HR 69 | Temp 98.1°F | Ht 66.0 in | Wt 155.0 lb

## 2021-05-16 DIAGNOSIS — I340 Nonrheumatic mitral (valve) insufficiency: Secondary | ICD-10-CM | POA: Diagnosis not present

## 2021-05-16 DIAGNOSIS — D649 Anemia, unspecified: Secondary | ICD-10-CM

## 2021-05-16 DIAGNOSIS — I10 Essential (primary) hypertension: Secondary | ICD-10-CM | POA: Diagnosis not present

## 2021-05-16 DIAGNOSIS — I071 Rheumatic tricuspid insufficiency: Secondary | ICD-10-CM

## 2021-05-16 DIAGNOSIS — E782 Mixed hyperlipidemia: Secondary | ICD-10-CM

## 2021-05-16 LAB — MICROALBUMIN, URINE WAIVED
Creatinine, Urine Waived: 50 mg/dL (ref 10–300)
Microalb, Ur Waived: 10 mg/L (ref 0–19)
Microalb/Creat Ratio: <30 mg/g (ref <30)

## 2021-05-16 MED ORDER — PRAVASTATIN SODIUM 40 MG PO TABS: 40.0000 mg | ORAL_TABLET | Freq: Every day | ORAL | 4 refills | Status: DC

## 2021-05-16 MED ORDER — LISINOPRIL-HYDROCHLOROTHIAZIDE 20-25 MG PO TABS
1.0000 | ORAL_TABLET | Freq: Every day | ORAL | 4 refills | Status: DC
Start: 2021-05-16 — End: 2021-11-14

## 2021-05-16 NOTE — Assessment & Plan Note (Signed)
Followed by cardiology, continue this collaboration. ?

## 2021-05-16 NOTE — Assessment & Plan Note (Signed)
Chronic, stable.  Continue current medication regimen and adjust as needed. Lipid panel and CMP today.  Refills sent in.  Return in 6 months. 

## 2021-05-16 NOTE — Patient Instructions (Signed)
Healthy Eating °Following a healthy eating pattern may help you to achieve and maintain a healthy body weight, reduce the risk of chronic disease, and live a long and productive life. It is important to follow a healthy eating pattern at an appropriate calorie level for your body. Your nutritional needs should be met primarily through food by choosing a variety of nutrient-rich foods. °What are tips for following this plan? °Reading food labels °Read labels and choose the following: °Reduced or low sodium. °Juices with 100% fruit juice. °Foods with low saturated fats and high polyunsaturated and monounsaturated fats. °Foods with whole grains, such as whole wheat, cracked wheat, brown rice, and wild rice. °Whole grains that are fortified with folic acid. This is recommended for women who are pregnant or who want to become pregnant. °Read labels and avoid the following: °Foods with a lot of added sugars. These include foods that contain brown sugar, corn sweetener, corn syrup, dextrose, fructose, glucose, high-fructose corn syrup, honey, invert sugar, lactose, malt syrup, maltose, molasses, raw sugar, sucrose, trehalose, or turbinado sugar. °Do not eat more than the following amounts of added sugar per day: °6 teaspoons (25 g) for women. °9 teaspoons (38 g) for men. °Foods that contain processed or refined starches and grains. °Refined grain products, such as white flour, degermed cornmeal, white bread, and white rice. °Shopping °Choose nutrient-rich snacks, such as vegetables, whole fruits, and nuts. Avoid high-calorie and high-sugar snacks, such as potato chips, fruit snacks, and candy. °Use oil-based dressings and spreads on foods instead of solid fats such as butter, stick margarine, or cream cheese. °Limit pre-made sauces, mixes, and "instant" products such as flavored rice, instant noodles, and ready-made pasta. °Try more plant-protein sources, such as tofu, tempeh, black beans, edamame, lentils, nuts, and  seeds. °Explore eating plans such as the Mediterranean diet or vegetarian diet. °Cooking °Use oil to sauté or stir-fry foods instead of solid fats such as butter, stick margarine, or lard. °Try baking, boiling, grilling, or broiling instead of frying. °Remove the fatty part of meats before cooking. °Steam vegetables in water or broth. °Meal planning ° °At meals, imagine dividing your plate into fourths: °One-half of your plate is fruits and vegetables. °One-fourth of your plate is whole grains. °One-fourth of your plate is protein, especially lean meats, poultry, eggs, tofu, beans, or nuts. °Include low-fat dairy as part of your daily diet. °Lifestyle °Choose healthy options in all settings, including home, work, school, restaurants, or stores. °Prepare your food safely: °Wash your hands after handling raw meats. °Keep food preparation surfaces clean by regularly washing with hot, soapy water. °Keep raw meats separate from ready-to-eat foods, such as fruits and vegetables. °Cook seafood, meat, poultry, and eggs to the recommended internal temperature. °Store foods at safe temperatures. In general: °Keep cold foods at 40°F (4.4°C) or below. °Keep hot foods at 140°F (60°C) or above. °Keep your freezer at 0°F (-17.8°C) or below. °Foods are no longer safe to eat when they have been between the temperatures of 40°-140°F (4.4-60°C) for more than 2 hours. °What foods should I eat? °Fruits °Aim to eat 2 cup-equivalents of fresh, canned (in natural juice), or frozen fruits each day. Examples of 1 cup-equivalent of fruit include 1 small apple, 8 large strawberries, 1 cup canned fruit, ½ cup dried fruit, or 1 cup 100% juice. °Vegetables °Aim to eat 2½-3 cup-equivalents of fresh and frozen vegetables each day, including different varieties and colors. Examples of 1 cup-equivalent of vegetables include 2 medium carrots, 2 cups raw,   leafy greens, 1 cup chopped vegetable (raw or cooked), or 1 medium baked potato. °Grains °Aim to  eat 6 ounce-equivalents of whole grains each day. Examples of 1 ounce-equivalent of grains include 1 slice of bread, 1 cup ready-to-eat cereal, 3 cups popcorn, or ½ cup cooked rice, pasta, or cereal. °Meats and other proteins °Aim to eat 5-6 ounce-equivalents of protein each day. Examples of 1 ounce-equivalent of protein include 1 egg, 1/2 cup nuts or seeds, or 1 tablespoon (16 g) peanut butter. A cut of meat or fish that is the size of a deck of cards is about 3-4 ounce-equivalents. °Of the protein you eat each week, try to have at least 8 ounces come from seafood. This includes salmon, trout, herring, and anchovies. °Dairy °Aim to eat 3 cup-equivalents of fat-free or low-fat dairy each day. Examples of 1 cup-equivalent of dairy include 1 cup (240 mL) milk, 8 ounces (250 g) yogurt, 1½ ounces (44 g) natural cheese, or 1 cup (240 mL) fortified soy milk. °Fats and oils °Aim for about 5 teaspoons (21 g) per day. Choose monounsaturated fats, such as canola and olive oils, avocados, peanut butter, and most nuts, or polyunsaturated fats, such as sunflower, corn, and soybean oils, walnuts, pine nuts, sesame seeds, sunflower seeds, and flaxseed. °Beverages °Aim for six 8-oz glasses of water per day. Limit coffee to three to five 8-oz cups per day. °Limit caffeinated beverages that have added calories, such as soda and energy drinks. °Limit alcohol intake to no more than 1 drink a day for nonpregnant women and 2 drinks a day for men. One drink equals 12 oz of beer (355 mL), 5 oz of wine (148 mL), or 1½ oz of hard liquor (44 mL). °Seasoning and other foods °Avoid adding excess amounts of salt to your foods. Try flavoring foods with herbs and spices instead of salt. °Avoid adding sugar to foods. °Try using oil-based dressings, sauces, and spreads instead of solid fats. °This information is based on general [place] nutrition guidelines. For more information, visit choosemyplate.gov. Exact amounts may vary based on your nutrition  needs. °Summary °A healthy eating plan may help you to maintain a healthy weight, reduce the risk of chronic diseases, and stay active throughout your life. °Plan your meals. Make sure you eat the right portions of a variety of nutrient-rich foods. °Try baking, boiling, grilling, or broiling instead of frying. °Choose healthy options in all settings, including home, work, school, restaurants, or stores. °This information is not intended to replace advice given to you by your health care provider. Make sure you discuss any questions you have with your health care provider. °Document Revised: 12/21/2020 Document Reviewed: 12/21/2020 °Elsevier Patient Education © 2022 [employer] ° °

## 2021-05-16 NOTE — Assessment & Plan Note (Signed)
Ongoing, mild since June 2021 -- normal iron level + negative FOBT.  Is very active at baseline.  Continue supplements at home and monitor.  If any significant decline will get into GI or hematology for further evaluation.  CBC, iron, ferritin, B12 today.

## 2021-05-16 NOTE — Progress Notes (Signed)
BP 131/76    Pulse 69    Temp 98.1 F (36.7 C) (Oral)    Ht 5\' 6"  (1.676 m)    Wt 155 lb (70.3 kg)    SpO2 99%    BMI 25.02 kg/m    Subjective:    Patient ID: Warren Richardson, male    DOB: 23-May-1955, 66 y.o.   MRN: [id number]  HPI: Warren Richardson is a 66 y.o. male  Chief Complaint  Patient presents with   Hyperlipidemia   Hypertension   HYPERTENSION / HYPERLIPIDEMIA Continues on Lisinopril-HCTZ, ASA, and Pravastatin.  Last saw cardiology on 01/20/21 [facility] -- he has known mild mitral and tricuspid regurgitations by echo in 2019.   Mild anemia noted ongoing past labs 11/12/20 -- H/H 12.1/35.5, MCV 99 with normal iron and folate + negative fecal occult blood.  He takes iron and multivitamin at home.  This started in June 2021. Satisfied with current treatment? yes Duration of hypertension: chronic BP monitoring frequency: rarely BP range: 135/76 this morning BP medication side effects: no Duration of hyperlipidemia: chronic Cholesterol medication side effects: no Cholesterol supplements: fish oil Medication compliance: good compliance Aspirin: yes Recent stressors: no Recurrent headaches: no Visual changes: no Palpitations: no Dyspnea: no Chest pain: no Lower extremity edema: no Dizzy/lightheaded: no  Relevant past medical, surgical, family and social history reviewed and updated as indicated. Interim medical history since our last visit reviewed. Allergies and medications reviewed and updated.  Review of Systems  Constitutional:  Negative for activity change, diaphoresis, fatigue and fever.  Respiratory:  Negative for cough, chest tightness, shortness of breath and wheezing.   Cardiovascular:  Negative for chest pain, palpitations and leg swelling.  Gastrointestinal: Negative.   Endocrine: Negative for cold intolerance and heat intolerance.  Neurological: Negative.   Psychiatric/Behavioral: Negative.     Per HPI unless specifically indicated above      Objective:    BP 131/76    Pulse 69    Temp 98.1 F (36.7 C) (Oral)    Ht 5\' 6"  (1.676 m)    Wt 155 lb (70.3 kg)    SpO2 99%    BMI 25.02 kg/m   Wt Readings from Last 3 Encounters:  05/16/21 155 lb (70.3 kg)  11/12/20 157 lb 6.4 oz (71.4 kg)  05/11/20 156 lb 3.2 oz (70.9 kg)    Physical Exam Vitals and nursing note reviewed.  Constitutional:      General: He is awake. He is not in acute distress.    Appearance: He is well-developed and well-groomed. He is not ill-appearing.  HENT:     Head: Normocephalic and atraumatic.     Right Ear: Hearing normal. No drainage.     Left Ear: Hearing normal. No drainage.  Eyes:     General: Lids are normal.        Right eye: No discharge.        Left eye: No discharge.     Conjunctiva/sclera: Conjunctivae normal.     Pupils: Pupils are equal, round, and reactive to light.  Neck:     Thyroid: No thyromegaly.     Vascular: No carotid bruit or JVD.     Trachea: Trachea normal.  Cardiovascular:     Rate and Rhythm: Normal rate and regular rhythm.     Heart sounds: Normal heart sounds, S1 normal and S2 normal. No murmur heard.   No gallop.  Pulmonary:     Effort: Pulmonary effort is normal.  Breath sounds: Normal breath sounds.  Abdominal:     General: Bowel sounds are normal.     Palpations: Abdomen is soft. There is no hepatomegaly or splenomegaly.  Musculoskeletal:        General: Normal range of motion.     Cervical back: Normal range of motion and neck supple.     Right lower leg: No edema.     Left lower leg: No edema.  Skin:    General: Skin is warm and dry.     Capillary Refill: Capillary refill takes less than 2 seconds.     Findings: No rash.  Neurological:     Mental Status: He is alert and oriented to person, place, and time.     Deep Tendon Reflexes: Reflexes are normal and symmetric.     Reflex Scores:      Brachioradialis reflexes are 2+ on the right side and 2+ on the left side.      Patellar reflexes are 2+ on  the right side and 2+ on the left side. Psychiatric:        Attention and Perception: Attention normal.        Mood and Affect: Mood normal.        Speech: Speech normal.        Behavior: Behavior normal. Behavior is cooperative.        Thought Content: Thought content normal.   Results for orders placed or performed in visit on 11/12/20  CBC with Differential/Platelet  Result Value Ref Range   WBC 4.5 3.4 - 10.8 x10E3/uL   RBC 3.59 (L) 4.14 - 5.80 x10E6/uL   Hemoglobin 12.1 (L) 13.0 - 17.7 g/dL   Hematocrit 91.4 (L) 78.2 - 51.0 %   MCV 99 (H) 79 - 97 fL   MCH 33.7 (H) 26.6 - 33.0 pg   MCHC 34.1 31.5 - 35.7 g/dL   RDW 95.6 21.3 - 08.6 %   Platelets 179 150 - 450 x10E3/uL   Neutrophils 61 Not Estab. %   Lymphs 24 Not Estab. %   Monocytes 13 Not Estab. %   Eos 1 Not Estab. %   Basos 1 Not Estab. %   Neutrophils Absolute 2.7 1.4 - 7.0 x10E3/uL   Lymphocytes Absolute 1.1 0.7 - 3.1 x10E3/uL   Monocytes Absolute 0.6 0.1 - 0.9 x10E3/uL   EOS (ABSOLUTE) 0.1 0.0 - 0.4 x10E3/uL   Basophils Absolute 0.0 0.0 - 0.2 x10E3/uL   Immature Granulocytes 0 Not Estab. %   Immature Grans (Abs) 0.0 0.0 - 0.1 x10E3/uL  Comprehensive metabolic panel  Result Value Ref Range   Glucose 96 65 - 99 mg/dL   BUN 17 8 - 27 mg/dL   Creatinine, Ser 5.78 0.76 - 1.27 mg/dL   eGFR 63 >46 NG/EXB/2.84   BUN/Creatinine Ratio 13 10 - 24   Sodium 132 (L) 134 - 144 mmol/L   Potassium 4.1 3.5 - 5.2 mmol/L   Chloride 95 (L) 96 - 106 mmol/L   CO2 20 20 - 29 mmol/L   Calcium 9.2 8.6 - 10.2 mg/dL   Total Protein 6.5 6.0 - 8.5 g/dL   Albumin 4.6 3.8 - 4.8 g/dL   Globulin, Total 1.9 1.5 - 4.5 g/dL   Albumin/Globulin Ratio 2.4 (H) 1.2 - 2.2   Bilirubin Total 0.3 0.0 - 1.2 mg/dL   Alkaline Phosphatase 57 44 - 121 IU/L   AST 42 (H) 0 - 40 IU/L   ALT 16 0 - 44 IU/L  Lipid Panel  w/o Chol/HDL Ratio  Result Value Ref Range   Cholesterol, Total 183 100 - 199 mg/dL   Triglycerides 409 (H) 0 - 149 mg/dL   HDL 56 >81  mg/dL   VLDL Cholesterol Cal 28 5 - 40 mg/dL   LDL Chol Calc (NIH) 99 0 - 99 mg/dL  TSH  Result Value Ref Range   TSH 1.940 0.450 - 4.500 uIU/mL  PSA  Result Value Ref Range   Prostate Specific Ag, Serum 3.7 0.0 - 4.0 ng/mL  Folate  Result Value Ref Range   Folate >20.0 >3.0 ng/mL      Assessment & Plan:   Problem List Items Addressed This Visit       Cardiovascular and Mediastinum   Benign essential hypertension - Primary    Chronic, stable. BP at goal in office and on occasional checks at home.  Recommend he monitor BP at least a few mornings a week at home and document.  DASH diet at home.  Continue current medication regimen and adjust as needed.  Labs today: CBC, CMP, urine ALB.  Return in 6 months.       Relevant Medications   lisinopril-hydrochlorothiazide (ZESTORETIC) 20-25 MG tablet   pravastatin (PRAVACHOL) 40 MG tablet   Other Relevant Orders   Comprehensive metabolic panel   Microalbumin, Urine Waived   Mild mitral regurgitation    Followed by cardiology, continue this collaboration.      Relevant Medications   lisinopril-hydrochlorothiazide (ZESTORETIC) 20-25 MG tablet   pravastatin (PRAVACHOL) 40 MG tablet   Mild tricuspid regurgitation    Followed by cardiology, continue this collaboration.      Relevant Medications   lisinopril-hydrochlorothiazide (ZESTORETIC) 20-25 MG tablet   pravastatin (PRAVACHOL) 40 MG tablet     Other   Low hemoglobin    Ongoing, mild since June 2021 -- normal iron level + negative FOBT.  Is very active at baseline.  Continue supplements at home and monitor.  If any significant decline will get into GI or hematology for further evaluation.  CBC, iron, ferritin, B12 today.      Relevant Orders   CBC with Differential/Platelet   Vitamin B12   Iron Binding Cap (TIBC)([facility])   Ferritin   Mixed hyperlipidemia    Chronic, stable.  Continue current medication regimen and adjust as needed. Lipid panel and CMP today.   Refills sent in.  Return in 6 months.      Relevant Medications   lisinopril-hydrochlorothiazide (ZESTORETIC) 20-25 MG tablet   pravastatin (PRAVACHOL) 40 MG tablet   Other Relevant Orders   Comprehensive metabolic panel   Lipid Panel w/o Chol/HDL Ratio     Follow up plan: Return in about 6 months (around 11/13/2021) for Annual Physical -- with PCV13 and Shingrix #1 to be given, discussed with patient.Marland Kitchen

## 2021-05-16 NOTE — Assessment & Plan Note (Signed)
Chronic, stable. BP at goal in office and on occasional checks at home.  Recommend he monitor BP at least a few mornings a week at home and document.  DASH diet at home.  Continue current medication regimen and adjust as needed.  Labs today: CBC, CMP, urine ALB.  Return in 6 months.

## 2021-05-17 ENCOUNTER — Other Ambulatory Visit: Payer: Self-pay | Admitting: Nurse Practitioner

## 2021-05-17 LAB — COMPREHENSIVE METABOLIC PANEL
ALT: 13 IU/L (ref 0–44)
AST: 42 IU/L — ABNORMAL HIGH (ref 0–40)
Albumin/Globulin Ratio: 2.3 — ABNORMAL HIGH (ref 1.2–2.2)
Albumin: 4.4 g/dL (ref 3.8–4.8)
Alkaline Phosphatase: 55 IU/L (ref 44–121)
BUN/Creatinine Ratio: 13 (ref 10–24)
BUN: 15 mg/dL (ref 8–27)
Bilirubin Total: 0.3 mg/dL (ref 0.0–1.2)
CO2: 22 mmol/L (ref 20–29)
Calcium: 9.6 mg/dL (ref 8.6–10.2)
Chloride: 100 mmol/L (ref 96–106)
Creatinine, Ser: 1.18 mg/dL (ref 0.76–1.27)
Globulin, Total: 1.9 g/dL (ref 1.5–4.5)
Glucose: 99 mg/dL (ref 70–99)
Potassium: 4.2 mmol/L (ref 3.5–5.2)
Sodium: 138 mmol/L (ref 134–144)
Total Protein: 6.3 g/dL (ref 6.0–8.5)
eGFR: 68 mL/min/{1.73_m2} (ref >59)

## 2021-05-17 LAB — LIPID PANEL W/O CHOL/HDL RATIO
Cholesterol, Total: 210 mg/dL — ABNORMAL HIGH (ref 100–199)
HDL: 65 mg/dL (ref >39)
LDL Chol Calc (NIH): 112 mg/dL — ABNORMAL HIGH (ref 0–99)
Triglycerides: 192 mg/dL — ABNORMAL HIGH (ref 0–149)
VLDL Cholesterol Cal: 33 mg/dL (ref 5–40)

## 2021-05-17 LAB — CBC WITH DIFFERENTIAL/PLATELET
Basophils Absolute: 0 10*3/uL (ref 0.0–0.2)
Basos: 1 %
EOS (ABSOLUTE): 0.1 10*3/uL (ref 0.0–0.4)
Eos: 1 %
Hematocrit: 38.2 % (ref 37.5–51.0)
Hemoglobin: 13 g/dL (ref 13.0–17.7)
Immature Grans (Abs): 0 10*3/uL (ref 0.0–0.1)
Immature Granulocytes: 1 %
Lymphocytes Absolute: 1.1 10*3/uL (ref 0.7–3.1)
Lymphs: 25 %
MCH: 33 pg (ref 26.6–33.0)
MCHC: 34 g/dL (ref 31.5–35.7)
MCV: 97 fL (ref 79–97)
Monocytes Absolute: 0.4 10*3/uL (ref 0.1–0.9)
Monocytes: 10 %
Neutrophils Absolute: 2.7 10*3/uL (ref 1.4–7.0)
Neutrophils: 62 %
Platelets: 255 10*3/uL (ref 150–450)
RBC: 3.94 x10E6/uL — ABNORMAL LOW (ref 4.14–5.80)
RDW: 12.3 % (ref 11.6–15.4)
WBC: 4.3 10*3/uL (ref 3.4–10.8)

## 2021-05-17 LAB — IRON AND TIBC
Iron Saturation: 34 % (ref 15–55)
Iron: 109 ug/dL (ref 38–169)
Total Iron Binding Capacity: 319 ug/dL (ref 250–450)
UIBC: 210 ug/dL (ref 111–343)

## 2021-05-17 LAB — VITAMIN B12: Vitamin B-12: 1027 pg/mL (ref 232–1245)

## 2021-05-17 LAB — FERRITIN: Ferritin: 261 ng/mL (ref 30–400)

## 2021-05-17 MED ORDER — ROSUVASTATIN CALCIUM 40 MG PO TABS: 40.0000 mg | ORAL_TABLET | Freq: Every day | ORAL | 4 refills | Status: DC

## 2021-05-17 NOTE — Progress Notes (Signed)
Contacted via [facility] morning Gleen, your labs have returned: - Your CBC shows improved hemoglobin and hematocrit levels.  B12, ferritin, and iron levels normal.  Great news!! - Kidney function, creatinine and eGFR, is normal.  Liver function, shows ongoing mild elevation in AST, but normal ALT.  Ensure to cut back on any alcohol or Tylenol use at home. - Cholesterol levels remain above goal.  I would like to stop your Pravastatin and change to Rosuvastatin 40 MG dosing, which may help more with improving these levels.  I will send this new medication in, finish out your Pravastatin and start the Rosuvastatin after this.  We will recheck levels next visit -- if this causes any issues we will return to Pravastatin.  Any questions? Keep being awesome!!  Thank you for allowing me to participate in your care.  I appreciate you. Kindest regards, Elienai Gailey

## 2021-08-13 ENCOUNTER — Encounter: Payer: Self-pay | Admitting: Nurse Practitioner

## 2021-08-13 DIAGNOSIS — E782 Mixed hyperlipidemia: Secondary | ICD-10-CM

## 2021-08-15 NOTE — Telephone Encounter (Signed)
Pt scheduled for lab tomorrow morning ?

## 2021-08-16 ENCOUNTER — Other Ambulatory Visit: Payer: Managed Care, Other (non HMO)

## 2021-08-16 DIAGNOSIS — E782 Mixed hyperlipidemia: Secondary | ICD-10-CM

## 2021-08-17 LAB — COMPREHENSIVE METABOLIC PANEL
ALT: 22 IU/L (ref 0–44)
AST: 42 IU/L — ABNORMAL HIGH (ref 0–40)
Albumin/Globulin Ratio: 2.3 — ABNORMAL HIGH (ref 1.2–2.2)
Albumin: 4.6 g/dL (ref 3.8–4.8)
Alkaline Phosphatase: 57 IU/L (ref 44–121)
BUN/Creatinine Ratio: 15 (ref 10–24)
BUN: 19 mg/dL (ref 8–27)
Bilirubin Total: 0.4 mg/dL (ref 0.0–1.2)
CO2: 26 mmol/L (ref 20–29)
Calcium: 10.2 mg/dL (ref 8.6–10.2)
Chloride: 101 mmol/L (ref 96–106)
Creatinine, Ser: 1.26 mg/dL (ref 0.76–1.27)
Globulin, Total: 2 g/dL (ref 1.5–4.5)
Glucose: 94 mg/dL (ref 70–99)
Potassium: 4.1 mmol/L (ref 3.5–5.2)
Sodium: 138 mmol/L (ref 134–144)
Total Protein: 6.6 g/dL (ref 6.0–8.5)
eGFR: 63 mL/min/{1.73_m2} (ref >59)

## 2021-08-17 LAB — LIPID PANEL W/O CHOL/HDL RATIO
Cholesterol, Total: 142 mg/dL (ref 100–199)
HDL: 50 mg/dL (ref >39)
LDL Chol Calc (NIH): 76 mg/dL (ref 0–99)
Triglycerides: 82 mg/dL (ref 0–149)
VLDL Cholesterol Cal: 16 mg/dL (ref 5–40)

## 2021-08-17 NOTE — Progress Notes (Signed)
Contacted via [facility] ? ? ?Good evening Jaiyon, your labs have returned: ?- Cholesterol labs are looking a lot better.  Continue Rosuvastatin. ?- Kidney function, creatinine and eGFR, remains normal.  Liver function continues to show mild elevation in AST, very mild, and normal ALT.  I recommend reduce and alcohol or Tylenol use for now.  Any questions? ?Keep being amazing!!  Thank you for allowing me to participate in your care.  I appreciate you. ?Kindest regards, ?Avrielle Fry ?

## 2021-10-12 ENCOUNTER — Encounter: Payer: Self-pay | Admitting: Nurse Practitioner

## 2021-11-12 NOTE — Patient Instructions (Signed)

## 2021-11-14 ENCOUNTER — Ambulatory Visit (INDEPENDENT_AMBULATORY_CARE_PROVIDER_SITE_OTHER): Payer: Managed Care, Other (non HMO) | Admitting: Nurse Practitioner

## 2021-11-14 ENCOUNTER — Ambulatory Visit: Payer: Managed Care, Other (non HMO)

## 2021-11-14 ENCOUNTER — Encounter: Payer: Self-pay | Admitting: Nurse Practitioner

## 2021-11-14 VITALS — BP 120/68 | HR 69 | Temp 97.7°F | Ht 66.25 in | Wt 147.8 lb

## 2021-11-14 DIAGNOSIS — I071 Rheumatic tricuspid insufficiency: Secondary | ICD-10-CM

## 2021-11-14 DIAGNOSIS — E782 Mixed hyperlipidemia: Secondary | ICD-10-CM

## 2021-11-14 DIAGNOSIS — D649 Anemia, unspecified: Secondary | ICD-10-CM

## 2021-11-14 DIAGNOSIS — N400 Enlarged prostate without lower urinary tract symptoms: Secondary | ICD-10-CM

## 2021-11-14 DIAGNOSIS — I340 Nonrheumatic mitral (valve) insufficiency: Secondary | ICD-10-CM

## 2021-11-14 DIAGNOSIS — Z0000 Encounter for general adult medical examination without abnormal findings: Secondary | ICD-10-CM | POA: Diagnosis not present

## 2021-11-14 DIAGNOSIS — Z23 Encounter for immunization: Secondary | ICD-10-CM | POA: Diagnosis not present

## 2021-11-14 DIAGNOSIS — I10 Essential (primary) hypertension: Secondary | ICD-10-CM | POA: Diagnosis not present

## 2021-11-14 DIAGNOSIS — M79602 Pain in left arm: Secondary | ICD-10-CM | POA: Insufficient documentation

## 2021-11-14 LAB — MICROALBUMIN, URINE WAIVED
Creatinine, Urine Waived: 100 mg/dL (ref 10–300)
Microalb, Ur Waived: 10 mg/L (ref 0–19)
Microalb/Creat Ratio: <30 mg/g (ref <30)

## 2021-11-14 MED ORDER — LISINOPRIL 20 MG PO TABS: 20.0000 mg | ORAL_TABLET | Freq: Every day | ORAL | 4 refills | Status: DC

## 2021-11-14 NOTE — Assessment & Plan Note (Signed)
Chronic, stable. BP below goal in office and well below goal at home, with some lows.  Recommend he monitor BP at least a few mornings a week at home and document.  DASH diet at home.  Will trial discontinuation of HCTZ and continue Lisinopril only at 20 MG daily, discussed with him and he is in agreeance to monitor BP closely at home and alert provider if elevations consistently >130/80.  Labs today: CBC, CMP, TSH, urine ALB.  Return in 6 months.

## 2021-11-14 NOTE — Assessment & Plan Note (Signed)
Improving at this time, continue to monitor closely and return if any worsening.

## 2021-11-14 NOTE — Assessment & Plan Note (Signed)
Followed by cardiology, continue this collaboration. ?

## 2021-11-14 NOTE — Assessment & Plan Note (Signed)
Chronic, stable.  Continue current medication regimen and adjust as needed. Lipid panel and CMP today.  Refills sent in.  Return in 6 months.

## 2021-11-14 NOTE — Progress Notes (Signed)
BP 120/68   Pulse 69   Temp 97.7 F (36.5 C) (Oral)   Ht 5' 6.25" (1.683 m)   Wt 147 lb 12.8 oz (67 kg)   SpO2 99%   BMI 23.68 kg/m    Subjective:    Patient ID: Warren Richardson, male    DOB: 1956/01/27, 66 y.o.   MRN: [id number]  HPI: Warren Richardson is a 66 y.o. male presenting on 11/14/2021 for comprehensive medical examination. Current medical complaints include:arm aches  He currently lives with: self Interim Problems from his last visit: arm aches  HYPERTENSION / HYPERLIPIDEMIA Continues on Lisinopril/HCTZ and Crestor + ASA.  History of lower hemoglobin and iron on labs = he is very active at baseline.  Sees cardiology annually due to mitral and tricuspid regurgitation. Satisfied with current treatment? yes Duration of hypertension: chronic BP monitoring frequency: a few times a month BP range: 97/52 to 109/57 BP medication side effects: no Duration of hyperlipidemia: chronic Cholesterol medication side effects: no Cholesterol supplements: none Medication compliance: good compliance Aspirin: yes Recent stressors: no Recurrent headaches: no Visual changes: no Palpitations: no Dyspnea: no Chest pain: no Lower extremity edema: no Dizzy/lightheaded: no   ARM PAIN Occasional arm aches to bicep/triceps area.  Starting from ulnar area with curling motion lifting weights.  This is not worsening and has been ongoing for months, with some improvement.  Ambidextrous.   Duration: chronic Location: left Mechanism of injury: unknown Onset: gradual Severity: mild  Quality:  dull and aching Frequency: intermittent Radiation: no Aggravating factors: weight bearing and movement  Alleviating factors: APAP and rest  Status: better Treatments attempted: rest and APAP  Relief with NSAIDs?:  No NSAIDs Taken Swelling: no Redness: no  Warmth: no Trauma: no Chest pain: no  Shortness of breath: no  Fever: no Decreased sensation: no Paresthesias: no Weakness: no    Functional Status Survey: Is the patient deaf or have difficulty hearing?: No Does the patient have difficulty seeing, even when wearing glasses/contacts?: No Does the patient have difficulty concentrating, remembering, or making decisions?: No Does the patient have difficulty walking or climbing stairs?: No Does the patient have difficulty dressing or bathing?: No Does the patient have difficulty doing errands alone such as visiting a doctor's office or shopping?: No  FALL RISK:    11/14/2021    8:08 AM 11/12/2020    8:25 AM 05/11/2020    8:07 AM 11/07/2019    8:03 AM 11/05/2018    8:16 AM  Fall Risk   Falls in the past year? 0 0 0 0 1  Number falls in past yr: 0 0  0 0  Injury with Fall? 0 0  0 0  Risk for fall due to : No Fall Risks No Fall Risks     Follow up Falls evaluation completed Falls evaluation completed  Falls evaluation completed Falls evaluation completed    Depression Screen    11/14/2021    8:11 AM 05/16/2021    8:09 AM 11/12/2020    8:25 AM 05/11/2020    8:07 AM 11/07/2019    8:03 AM  Depression screen PHQ 2/9  Decreased Interest 0 0 0 0 0  Down, Depressed, Hopeless 0 0 0 0 0  PHQ - 2 Score 0 0 0 0 0  Altered sleeping 0 0     Tired, decreased energy 0 0     Change in appetite 0 0     Feeling bad or failure about yourself  0  0     Trouble concentrating 0 0     Moving slowly or fidgety/restless 0 0     Suicidal thoughts 0 0     PHQ-9 Score 0 0     Difficult doing work/chores Not difficult at all        Advanced Directives <no information>  Past Medical History:  Past Medical History:  Diagnosis Date   Hyperlipidemia    Hypertension     Surgical History:  History reviewed. No pertinent surgical history.  Medications:  Current Outpatient Medications on File Prior to Visit  Medication Sig   aspirin 81 MG chewable tablet Chew 81 mg by mouth daily.   BIOTIN PO Take 250 mg by mouth daily.   Flavoring Agent (BLACKBERRY FLAVOR) LIQD Take by mouth.    Ginger, Zingiber officinalis, (GINGER ROOT PO) 1 Dose once daily   glucosamine-chondroitin 500-400 MG tablet Take 1 tablet by mouth 2 (two) times daily.   Multiple Vitamin (MULTI-VITAMINS) TABS Take 1 tablet by mouth daily.   Omega-3 Fatty Acids (FISH OIL) 1000 MG CAPS Take 1,000 mg by mouth 2 (two) times daily.   rosuvastatin (CRESTOR) 40 MG tablet Take 1 tablet (40 mg total) by mouth daily.   Saw Palmetto, Serenoa repens, (SAW PALMETTO PO) Take by mouth.   No current facility-administered medications on file prior to visit.    Allergies:  Allergies  Allergen Reactions   Atorvastatin Other (See Comments)    Increased liver enzymes    Social History:  Social History   Socioeconomic History   Marital status: Single    Spouse name: Not on file   Number of children: Not on file   Years of education: Not on file   Highest education level: Not on file  Occupational History   Not on file  Tobacco Use   Smoking status: Never   Smokeless tobacco: Never  Vaping Use   Vaping Use: Never used  Substance and Sexual Activity   Alcohol use: Yes    Alcohol/week: 1.0 - 2.0 standard drink of alcohol    Types: 1 - 2 Cans of beer per week   Drug use: No   Sexual activity: Yes  Other Topics Concern   Not on file  Social History Narrative   Not on file   Social Determinants of Health   Financial Resource Strain: Low Risk  (05/16/2021)   Overall Financial Resource Strain (CARDIA)    Difficulty of Paying Living Expenses: Not hard at all  Food Insecurity: No Food Insecurity (05/16/2021)   Hunger Vital Sign    Worried About Running Out of Food in the Last Year: Never true    Ran Out of Food in the Last Year: Never true  Transportation Needs: No Transportation Needs (05/16/2021)   PRAPARE - Administrator, Civil Service (Medical): No    Lack of Transportation (Non-Medical): No  Physical Activity: Sufficiently Active (05/16/2021)   Exercise Vital Sign    Days of Exercise per Week: 5  days    Minutes of Exercise per Session: 60 min  Stress: No Stress Concern Present (05/16/2021)   [employer] of Occupational Health - Occupational Stress Questionnaire    Feeling of Stress : Only a little  Social Connections: Socially Isolated (05/16/2021)   Social Connection and Isolation Panel [NHANES]    Frequency of Communication with Friends and Family: More than three times a week    Frequency of Social Gatherings with Friends and Family: More than three  times a week    Attends Religious Services: Never    Active Member of Clubs or Organizations: No    Attends Banker Meetings: Never    Marital Status: Divorced  Catering manager Violence: Not At Risk (05/16/2021)   Humiliation, Afraid, Rape, and Kick questionnaire    Fear of Current or Ex-Partner: No    Emotionally Abused: No    Physically Abused: No    Sexually Abused: No   Social History   Tobacco Use  Smoking Status Never  Smokeless Tobacco Never   Social History   Substance and Sexual Activity  Alcohol Use Yes   Alcohol/week: 1.0 - 2.0 standard drink of alcohol   Types: 1 - 2 Cans of beer per week    Family History:  Family History  Problem Relation Age of Onset   Arthritis Mother    Stroke Mother    Dementia Mother    Cancer Father        prostate   Diabetes Father    Hyperlipidemia Father    Hypertension Father    Cancer Sister        breast    Past medical history, surgical history, medications, allergies, family history and social history reviewed with patient today and changes made to appropriate areas of the chart.   ROS All other ROS negative except what is listed above and in the HPI.      Objective:    BP 120/68   Pulse 69   Temp 97.7 F (36.5 C) (Oral)   Ht 5' 6.25" (1.683 m)   Wt 147 lb 12.8 oz (67 kg)   SpO2 99%   BMI 23.68 kg/m   Wt Readings from Last 3 Encounters:  11/14/21 147 lb 12.8 oz (67 kg)  05/16/21 155 lb (70.3 kg)  11/12/20 157 lb 6.4 oz (71.4 kg)     Physical Exam Vitals and nursing note reviewed.  Constitutional:      General: He is awake. He is not in acute distress.    Appearance: He is well-developed and well-groomed. He is not ill-appearing or toxic-appearing.  HENT:     Head: Normocephalic and atraumatic.     Right Ear: Hearing, tympanic membrane, ear canal and external ear normal. No drainage.     Left Ear: Hearing, tympanic membrane, ear canal and external ear normal. No drainage.     Nose: Nose normal.     Mouth/Throat:     Pharynx: Uvula midline.  Eyes:     General: Lids are normal.        Right eye: No discharge.        Left eye: No discharge.     Extraocular Movements: Extraocular movements intact.     Conjunctiva/sclera: Conjunctivae normal.     Pupils: Pupils are equal, round, and reactive to light.     Visual Fields: Right eye visual fields normal and left eye visual fields normal.  Neck:     Thyroid: No thyromegaly.     Vascular: No carotid bruit or JVD.     Trachea: Trachea normal.  Cardiovascular:     Rate and Rhythm: Normal rate and regular rhythm.     Heart sounds: Normal heart sounds, S1 normal and S2 normal. No murmur heard.    No gallop.  Pulmonary:     Effort: Pulmonary effort is normal. No accessory muscle usage or respiratory distress.     Breath sounds: Normal breath sounds.  Abdominal:     General: Bowel  sounds are normal.     Palpations: Abdomen is soft. There is no hepatomegaly or splenomegaly.     Tenderness: There is no abdominal tenderness.  Musculoskeletal:        General: Normal range of motion.     Cervical back: Normal range of motion and neck supple.     Right lower leg: No edema.     Left lower leg: No edema.  Lymphadenopathy:     Head:     Right side of head: No submental, submandibular, tonsillar, preauricular or posterior auricular adenopathy.     Left side of head: No submental, submandibular, tonsillar, preauricular or posterior auricular adenopathy.     Cervical: No  cervical adenopathy.  Skin:    General: Skin is warm and dry.     Capillary Refill: Capillary refill takes less than 2 seconds.     Findings: No rash.  Neurological:     Mental Status: He is alert and oriented to person, place, and time.     Gait: Gait is intact.     Deep Tendon Reflexes: Reflexes are normal and symmetric.     Reflex Scores:      Brachioradialis reflexes are 2+ on the right side and 2+ on the left side.      Patellar reflexes are 2+ on the right side and 2+ on the left side. Psychiatric:        Attention and Perception: Attention normal.        Mood and Affect: Mood normal.        Speech: Speech normal.        Behavior: Behavior normal. Behavior is cooperative.        Thought Content: Thought content normal.        Cognition and Memory: Cognition normal.     Results for orders placed or performed in visit on 08/16/21  Comp Met (CMET)  Result Value Ref Range   Glucose 94 70 - 99 mg/dL   BUN 19 8 - 27 mg/dL   Creatinine, Ser 0.98 0.76 - 1.27 mg/dL   eGFR 63 >11 BJ/YNW/2.95   BUN/Creatinine Ratio 15 10 - 24   Sodium 138 134 - 144 mmol/L   Potassium 4.1 3.5 - 5.2 mmol/L   Chloride 101 96 - 106 mmol/L   CO2 26 20 - 29 mmol/L   Calcium 10.2 8.6 - 10.2 mg/dL   Total Protein 6.6 6.0 - 8.5 g/dL   Albumin 4.6 3.8 - 4.8 g/dL   Globulin, Total 2.0 1.5 - 4.5 g/dL   Albumin/Globulin Ratio 2.3 (H) 1.2 - 2.2   Bilirubin Total 0.4 0.0 - 1.2 mg/dL   Alkaline Phosphatase 57 44 - 121 IU/L   AST 42 (H) 0 - 40 IU/L   ALT 22 0 - 44 IU/L  Lipid Panel w/o Chol/HDL Ratio  Result Value Ref Range   Cholesterol, Total 142 100 - 199 mg/dL   Triglycerides 82 0 - 149 mg/dL   HDL 50 >62 mg/dL   VLDL Cholesterol Cal 16 5 - 40 mg/dL   LDL Chol Calc (NIH) 76 0 - 99 mg/dL      Assessment & Plan:   Problem List Items Addressed This Visit       Cardiovascular and Mediastinum   Benign essential hypertension - Primary    Chronic, stable. BP below goal in office and well below goal  at home, with some lows.  Recommend he monitor BP at least a few mornings a week at home  and document.  DASH diet at home.  Will trial discontinuation of HCTZ and continue Lisinopril only at 20 MG daily, discussed with him and he is in agreeance to monitor BP closely at home and alert provider if elevations consistently >130/80.  Labs today: CBC, CMP, TSH, urine ALB.  Return in 6 months.       Relevant Medications   lisinopril (ZESTRIL) 20 MG tablet   Other Relevant Orders   Microalbumin, Urine Waived   Comprehensive metabolic panel   TSH   Mild mitral regurgitation    Followed by cardiology, continue this collaboration.      Relevant Medications   lisinopril (ZESTRIL) 20 MG tablet   Other Relevant Orders   Comprehensive metabolic panel   Lipid Panel w/o Chol/HDL Ratio   Mild tricuspid regurgitation    Followed by cardiology, continue this collaboration.      Relevant Medications   lisinopril (ZESTRIL) 20 MG tablet     Other   Left arm pain    Improving at this time, continue to monitor closely and return if any worsening.      Low hemoglobin    Ongoing, mild since June 2021 -- normal iron level + negative FOBT past check.  Is very active at baseline, suspect this may be reason for mild low levels.  Continue supplements at home and monitor.  If any significant decline will get into GI or hematology for further evaluation.  CBC, iron, ferritin today.      Relevant Orders   CBC with Differential/Platelet   Iron Binding Cap (TIBC)([facility])   Ferritin   Mixed hyperlipidemia    Chronic, stable.  Continue current medication regimen and adjust as needed. Lipid panel and CMP today.  Refills sent in.  Return in 6 months.      Relevant Medications   lisinopril (ZESTRIL) 20 MG tablet   Other Relevant Orders   Comprehensive metabolic panel   Lipid Panel w/o Chol/HDL Ratio   Other Visit Diagnoses     Benign prostatic hyperplasia without lower urinary tract symptoms        PSA on labs today.   Relevant Orders   PSA   Need for shingles vaccine       Shingrix # 1 provided in office today and educated on this.   Relevant Orders   Varicella-zoster vaccine IM (Shingrix)   Pneumococcal vaccination given       PCV13 provided in office today and educated on this.   Relevant Orders   Pneumococcal conjugate vaccine 13-valent   Encounter for annual physical exam       Annual physical with labs today and health maintenance reviewed with patient.       Discussed aspirin prophylaxis for myocardial infarction prevention and decision was made to continue ASA  LABORATORY TESTING:  Health maintenance labs ordered today as discussed above.   The natural history of prostate cancer and ongoing controversy regarding screening and potential treatment outcomes of prostate cancer has been discussed with the patient. The meaning of a false positive PSA and a false negative PSA has been discussed. He indicates understanding of the limitations of this screening test and wishes to proceed with screening PSA testing.   IMMUNIZATIONS:   - Tdap: Tetanus vaccination status reviewed: last tetanus booster within 10 years. - Influenza: Up to date - Pneumovax: Not applicable - Prevnar: Administered today - Zostavax vaccine: Administered today  SCREENING: - Colonoscopy: Up to date  Discussed with patient purpose of the colonoscopy is  to detect colon cancer at curable precancerous or early stages   - AAA Screening: Not applicable  -Hearing Test: Not applicable  -Spirometry: Not applicable   PATIENT COUNSELING:    Sexuality: Discussed sexually transmitted diseases, partner selection, use of condoms, avoidance of unintended pregnancy  and contraceptive alternatives.   Advised to avoid cigarette smoking.  I discussed with the patient that most people either abstain from alcohol or drink within safe limits (<=14/week and <=4 drinks/occasion for males, <=7/weeks and <= 3  drinks/occasion for females) and that the risk for alcohol disorders and other health effects rises proportionally with the number of drinks per week and how often a drinker exceeds daily limits.  Discussed cessation/primary prevention of drug use and availability of treatment for abuse.   Diet: Encouraged to adjust caloric intake to maintain  or achieve ideal body weight, to reduce intake of dietary saturated fat and total fat, to limit sodium intake by avoiding high sodium foods and not adding table salt, and to maintain adequate dietary potassium and calcium preferably from fresh fruits, vegetables, and low-fat dairy products.    Stressed the importance of regular exercise  Injury prevention: Discussed safety belts, safety helmets, smoke detector, smoking near bedding or upholstery.   Dental health: Discussed importance of regular tooth brushing, flossing, and dental visits.   Follow up plan: NEXT PREVENTATIVE PHYSICAL DUE IN 1 YEAR. Return in about 6 months (around 05/17/2022) for HTN/HLD + Shingrix # 2 in 2 months nurse visit.

## 2021-11-14 NOTE — Assessment & Plan Note (Signed)
Ongoing, mild since June 2021 -- normal iron level + negative FOBT past check.  Is very active at baseline, suspect this may be reason for mild low levels.  Continue supplements at home and monitor.  If any significant decline will get into GI or hematology for further evaluation.  CBC, iron, ferritin today.

## 2021-11-15 LAB — COMPREHENSIVE METABOLIC PANEL
ALT: 19 IU/L (ref 0–44)
AST: 32 IU/L (ref 0–40)
Albumin/Globulin Ratio: 2.3 — ABNORMAL HIGH (ref 1.2–2.2)
Albumin: 4.5 g/dL (ref 3.9–4.9)
Alkaline Phosphatase: 69 IU/L (ref 44–121)
BUN/Creatinine Ratio: 19 (ref 10–24)
BUN: 23 mg/dL (ref 8–27)
Bilirubin Total: 0.4 mg/dL (ref 0.0–1.2)
CO2: 23 mmol/L (ref 20–29)
Calcium: 9.9 mg/dL (ref 8.6–10.2)
Chloride: 102 mmol/L (ref 96–106)
Creatinine, Ser: 1.2 mg/dL (ref 0.76–1.27)
Globulin, Total: 2 g/dL (ref 1.5–4.5)
Glucose: 85 mg/dL (ref 70–99)
Potassium: 4.2 mmol/L (ref 3.5–5.2)
Sodium: 140 mmol/L (ref 134–144)
Total Protein: 6.5 g/dL (ref 6.0–8.5)
eGFR: 67 mL/min/{1.73_m2} (ref >59)

## 2021-11-15 LAB — CBC WITH DIFFERENTIAL/PLATELET
Basophils Absolute: 0 10*3/uL (ref 0.0–0.2)
Basos: 1 %
EOS (ABSOLUTE): 0.1 10*3/uL (ref 0.0–0.4)
Eos: 1 %
Hematocrit: 37.4 % — ABNORMAL LOW (ref 37.5–51.0)
Hemoglobin: 12.3 g/dL — ABNORMAL LOW (ref 13.0–17.7)
Immature Grans (Abs): 0 10*3/uL (ref 0.0–0.1)
Immature Granulocytes: 0 %
Lymphocytes Absolute: 1 10*3/uL (ref 0.7–3.1)
Lymphs: 23 %
MCH: 31.5 pg (ref 26.6–33.0)
MCHC: 32.9 g/dL (ref 31.5–35.7)
MCV: 96 fL (ref 79–97)
Monocytes Absolute: 0.3 10*3/uL (ref 0.1–0.9)
Monocytes: 7 %
Neutrophils Absolute: 2.8 10*3/uL (ref 1.4–7.0)
Neutrophils: 68 %
Platelets: 188 10*3/uL (ref 150–450)
RBC: 3.9 x10E6/uL — ABNORMAL LOW (ref 4.14–5.80)
RDW: 12.1 % (ref 11.6–15.4)
WBC: 4.2 10*3/uL (ref 3.4–10.8)

## 2021-11-15 LAB — LIPID PANEL W/O CHOL/HDL RATIO
Cholesterol, Total: 144 mg/dL (ref 100–199)
HDL: 42 mg/dL (ref >39)
LDL Chol Calc (NIH): 82 mg/dL (ref 0–99)
Triglycerides: 107 mg/dL (ref 0–149)
VLDL Cholesterol Cal: 20 mg/dL (ref 5–40)

## 2021-11-15 LAB — IRON AND TIBC
Iron Saturation: 39 % (ref 15–55)
Iron: 96 ug/dL (ref 38–169)
Total Iron Binding Capacity: 247 ug/dL — ABNORMAL LOW (ref 250–450)
UIBC: 151 ug/dL (ref 111–343)

## 2021-11-15 LAB — FERRITIN: Ferritin: 245 ng/mL (ref 30–400)

## 2021-11-15 LAB — TSH: TSH: 1.84 u[IU]/mL (ref 0.450–4.500)

## 2021-11-15 LAB — PSA: Prostate Specific Ag, Serum: 3.2 ng/mL (ref 0.0–4.0)

## 2021-11-15 NOTE — Progress Notes (Signed)
Contacted via MyChart   Good evening Warren Richardson, your labs have returned and overall they remain stable.  Your hemoglobin and hematocrit continue to show some mild anemia, but iron level stable.  Continue diet focus and supplements at home.  Overall remainder of labs are fabulous, continue all medications.  Any questions?  We will fax sheet. Keep being stellar!!  Thank you for allowing me to participate in your care.  I appreciate you. Kindest regards, Eileene Kisling

## 2022-01-10 ENCOUNTER — Other Ambulatory Visit: Payer: Self-pay

## 2022-01-10 DIAGNOSIS — Z23 Encounter for immunization: Secondary | ICD-10-CM

## 2022-01-16 ENCOUNTER — Ambulatory Visit (INDEPENDENT_AMBULATORY_CARE_PROVIDER_SITE_OTHER): Payer: Managed Care, Other (non HMO)

## 2022-01-16 DIAGNOSIS — Z23 Encounter for immunization: Secondary | ICD-10-CM | POA: Diagnosis not present

## 2022-01-16 NOTE — Progress Notes (Signed)
Patient presents today for 2nd Shingrix  vaccination, patient received in left  deltoid, patient tolerated well.   

## 2022-03-03 ENCOUNTER — Ambulatory Visit (INDEPENDENT_AMBULATORY_CARE_PROVIDER_SITE_OTHER): Payer: Managed Care, Other (non HMO)

## 2022-03-03 DIAGNOSIS — Z23 Encounter for immunization: Secondary | ICD-10-CM

## 2022-05-15 NOTE — Patient Instructions (Signed)

## 2022-05-17 ENCOUNTER — Ambulatory Visit (INDEPENDENT_AMBULATORY_CARE_PROVIDER_SITE_OTHER): Payer: Managed Care, Other (non HMO) | Admitting: Nurse Practitioner

## 2022-05-17 ENCOUNTER — Encounter: Payer: Self-pay | Admitting: Nurse Practitioner

## 2022-05-17 VITALS — BP 153/79 | HR 73 | Temp 97.8°F | Ht 66.26 in | Wt 150.9 lb

## 2022-05-17 DIAGNOSIS — D649 Anemia, unspecified: Secondary | ICD-10-CM

## 2022-05-17 DIAGNOSIS — I10 Essential (primary) hypertension: Secondary | ICD-10-CM

## 2022-05-17 DIAGNOSIS — R1012 Left upper quadrant pain: Secondary | ICD-10-CM

## 2022-05-17 DIAGNOSIS — H9313 Tinnitus, bilateral: Secondary | ICD-10-CM

## 2022-05-17 DIAGNOSIS — E782 Mixed hyperlipidemia: Secondary | ICD-10-CM | POA: Diagnosis not present

## 2022-05-17 DIAGNOSIS — I340 Nonrheumatic mitral (valve) insufficiency: Secondary | ICD-10-CM

## 2022-05-17 DIAGNOSIS — I071 Rheumatic tricuspid insufficiency: Secondary | ICD-10-CM

## 2022-05-17 LAB — URINALYSIS, ROUTINE W REFLEX MICROSCOPIC
Bilirubin, UA: NEGATIVE
Glucose, UA: NEGATIVE
Nitrite, UA: NEGATIVE
Protein,UA: NEGATIVE
RBC, UA: NEGATIVE
Specific Gravity, UA: 1.015 (ref 1.005–1.030)
Urobilinogen, Ur: 0.2 mg/dL (ref 0.2–1.0)
pH, UA: 6.5 (ref 5.0–7.5)

## 2022-05-17 LAB — MICROSCOPIC EXAMINATION: Bacteria, UA: NONE SEEN

## 2022-05-17 MED ORDER — LISINOPRIL 40 MG PO TABS: 40.0000 mg | ORAL_TABLET | Freq: Every day | ORAL | 4 refills | Status: DC

## 2022-05-17 NOTE — Assessment & Plan Note (Signed)
Acute and intermittent episodes with no other symptoms.  ?acute diverticulitis episodes, he eats lots of nuts and products with seeds.  Recommend focus on diet changes to see if resolution of discomfort.  Check labs today: CBC, CMP, UA.

## 2022-05-17 NOTE — Progress Notes (Signed)
Contacted via MyChart   Urine overall looks good, no signs of infection.  Some mild dehydration with trace ketones.

## 2022-05-17 NOTE — Assessment & Plan Note (Signed)
Followed by cardiology, continue this collaboration. ?

## 2022-05-17 NOTE — Assessment & Plan Note (Addendum)
Chronic, ongoing with recent elevations and elevated in office today.  Recommend he monitor BP at least a few mornings a week at home and document.  DASH diet at home.  Will increase Lisinopril to 40 MG daily, alert provider if elevations consistently >130/80. Consider 12.5 MG HCTZ next visit if ongoing elevations, 25 MG caused hypotension.  Labs today: CBC, CMP, TSH, urine ALB.  Return in 4 weeks.

## 2022-05-17 NOTE — Assessment & Plan Note (Signed)
Ongoing, mild since June 2021 -- normal iron level + negative FOBT past checks.  Is very active at baseline, suspect this may be reason for mild low levels.  Continue supplements at home and monitor.  If any significant decline will get into GI or hematology for further evaluation.  CBC, iron, ferritin today.

## 2022-05-17 NOTE — Progress Notes (Signed)
BP (!) 153/79   Pulse 73   Temp 97.8 F (36.6 C) (Oral)   Ht 5' 6.26" (1.683 m)   Wt 150 lb 14.4 oz (68.4 kg)   SpO2 99%   BMI 24.17 kg/m    Subjective:    Patient ID: Warren Richardson, male    DOB: 1956-04-02, 67 y.o.   MRN: [id number]  HPI: Warren Richardson is a 67 y.o. male  Chief Complaint  Patient presents with   Hypertension   Hyperlipidemia   HYPERTENSION / HYPERLIPIDEMIA Continues on Lisinopril, ASA, and Pravastatin.  Last saw cardiology on 02/20/22 [facility] -- he has known mild mitral and tricuspid regurgitations by echo in 2019. They increased his Lisinopril to 30 MG, BP continues to have some higher readings.  Did take HCTZ 25 MG in past, but this caused lower BP.   Satisfied with current treatment? yes Duration of hypertension: chronic BP monitoring frequency: rarely BP range: 108/71 to 180/89 range since October = on average it has been running higher BP medication side effects: no Duration of hyperlipidemia: chronic Cholesterol medication side effects: no Cholesterol supplements: fish oil Medication compliance: good compliance Aspirin: yes Recent stressors: no Recurrent headaches: no Visual changes: no Palpitations: no Dyspnea: no Chest pain: no Lower extremity edema: no Dizzy/lightheaded: no The 10-year ASCVD risk score (Arnett DK, et al., 2019) is: 18.9%   Values used to calculate the score:     Age: 45 years     Sex: Male     Is Non-Hispanic African American: No     Diabetic: No     Tobacco smoker: No     Systolic Blood Pressure: 153 mmHg     Is BP treated: Yes     HDL Cholesterol: 42 mg/dL     Total Cholesterol: 144 mg/dL   TINNITUS Long time issue.  Feels this is worsening. Duration: months Description of tinnitus: ringing high-pitch Pulsatile: no Tinnitus duration: continuous Episode frequency: continous Severity: moderate Aggravating factors: quiet Alleviating factors: noisy environment Head injury:when 67 years old "busted  head open" Chronic exposure to loud noises: no Exposure to ototoxic medications: no Vertigo: occasional, rare Hearing loss: yes Aural fullness: no Headache:no  TMJ syndrome symptoms: no Unsteady gait: no Postural instability: no Diplopia, dysarthria, dysphagia or weakness: no Anxietydepression: no     05/17/2022    8:14 AM 11/14/2021    8:11 AM 05/16/2021    8:09 AM 11/12/2020    8:25 AM 05/11/2020    8:07 AM  Depression screen PHQ 2/9  Decreased Interest 0 0 0 0 0  Down, Depressed, Hopeless 0 0 0 0 0  PHQ - 2 Score 0 0 0 0 0  Altered sleeping 0 0 0    Tired, decreased energy 0 0 0    Change in appetite 0 0 0    Feeling bad or failure about yourself  0 0 0    Trouble concentrating 0 0 0    Moving slowly or fidgety/restless 0 0 0    Suicidal thoughts 0 0 0    PHQ-9 Score 0 0 0    Difficult doing work/chores Not difficult at all Not difficult at all          05/17/2022    8:14 AM 11/14/2021    8:12 AM 05/16/2021    8:10 AM  GAD 7 : Generalized Anxiety Score  Nervous, Anxious, on Edge 0 0 0  Control/stop worrying 0 0 0  Worry too much -  different things 0 0 0  Trouble relaxing 0 0 0  Restless 0 0 0  Easily annoyed or irritable 0 0 0  Afraid - awful might happen 0 0 0  Total GAD 7 Score 0 0 0  Anxiety Difficulty Not difficult at all Not difficult at all Not difficult at all     ABDOMINAL PAIN  Has been having some lower abdominal pain to LLQ, present since last spring (2023) noticed while driving/throbbing, then this stopped.  In past 6 months has returned.  Describes this not as pain, but discomfort -- may last one day or two days.  Last colonoscopy 07/12/2018 -- unable to pull results in Epic -- does not need to return for 10 years.  Does have history of low hemoglobin on labs.  He does eat strawberries every morning. Duration:months Onset: sudden Severity: mild Quality: dull and aching Location:  LLQ  Episode duration:  Radiation: no Frequency: intermittent Alleviating  factors: nothing Aggravating factors: unknown Status: stable Treatments attempted: none Fever: no Nausea: no Vomiting: no Weight loss: no Decreased appetite: no Diarrhea: occasional Constipation: occasional Blood in stool: no Heartburn: no Jaundice: no Rash: no Dysuria/urinary frequency: no Hematuria: no History of sexually transmitted disease: no Recurrent NSAID use: no   Relevant past medical, surgical, family and social history reviewed and updated as indicated. Interim medical history since our last visit reviewed. Allergies and medications reviewed and updated.  Review of Systems  Constitutional:  Negative for activity change, diaphoresis, fatigue and fever.  HENT:  Positive for tinnitus.   Respiratory:  Negative for cough, chest tightness, shortness of breath and wheezing.   Cardiovascular:  Negative for chest pain, palpitations and leg swelling.  Gastrointestinal:  Positive for abdominal pain, constipation and diarrhea. Negative for abdominal distention, anal bleeding, blood in stool, nausea and vomiting.  Endocrine: Negative for cold intolerance and heat intolerance.  Neurological: Negative.   Psychiatric/Behavioral: Negative.      Per HPI unless specifically indicated above     Objective:    BP (!) 153/79   Pulse 73   Temp 97.8 F (36.6 C) (Oral)   Ht 5' 6.26" (1.683 m)   Wt 150 lb 14.4 oz (68.4 kg)   SpO2 99%   BMI 24.17 kg/m   Wt Readings from Last 3 Encounters:  05/17/22 150 lb 14.4 oz (68.4 kg)  11/14/21 147 lb 12.8 oz (67 kg)  05/16/21 155 lb (70.3 kg)    Physical Exam Vitals and nursing note reviewed.  Constitutional:      General: He is awake. He is not in acute distress.    Appearance: He is well-developed and well-groomed. He is not ill-appearing.  HENT:     Head: Normocephalic and atraumatic.     Right Ear: Hearing, tympanic membrane, ear canal and external ear normal. No drainage. There is no impacted cerumen.     Left Ear: Hearing,  tympanic membrane, ear canal and external ear normal. No drainage. There is no impacted cerumen.  Eyes:     General: Lids are normal.        Right eye: No discharge.        Left eye: No discharge.     Conjunctiva/sclera: Conjunctivae normal.     Pupils: Pupils are equal, round, and reactive to light.  Neck:     Thyroid: No thyromegaly.     Vascular: No carotid bruit or JVD.     Trachea: Trachea normal.  Cardiovascular:     Rate and Rhythm:  Normal rate and regular rhythm.     Heart sounds: Normal heart sounds, S1 normal and S2 normal. No murmur heard.    No gallop.  Pulmonary:     Effort: Pulmonary effort is normal.     Breath sounds: Normal breath sounds.  Abdominal:     General: Bowel sounds are normal. There is no distension.     Palpations: Abdomen is soft. There is no hepatomegaly.     Tenderness: There is no abdominal tenderness. There is no right CVA tenderness or left CVA tenderness.  Musculoskeletal:        General: Normal range of motion.     Cervical back: Normal range of motion and neck supple.     Right lower leg: No edema.     Left lower leg: No edema.  Skin:    General: Skin is warm and dry.     Capillary Refill: Capillary refill takes less than 2 seconds.     Findings: No rash.  Neurological:     Mental Status: He is alert and oriented to person, place, and time.     Deep Tendon Reflexes: Reflexes are normal and symmetric.     Reflex Scores:      Brachioradialis reflexes are 2+ on the right side and 2+ on the left side.      Patellar reflexes are 2+ on the right side and 2+ on the left side. Psychiatric:        Attention and Perception: Attention normal.        Mood and Affect: Mood normal.        Speech: Speech normal.        Behavior: Behavior normal. Behavior is cooperative.        Thought Content: Thought content normal.    Results for orders placed or performed in visit on 11/14/21  Microalbumin, Urine Waived  Result Value Ref Range   Microalb, Ur  Waived 10 0 - 19 mg/L   Creatinine, Urine Waived 100 10 - 300 mg/dL   Microalb/Creat Ratio <30 <30 mg/g  CBC with Differential/Platelet  Result Value Ref Range   WBC 4.2 3.4 - 10.8 x10E3/uL   RBC 3.90 (L) 4.14 - 5.80 x10E6/uL   Hemoglobin 12.3 (L) 13.0 - 17.7 g/dL   Hematocrit 16.1 (L) 09.6 - 51.0 %   MCV 96 79 - 97 fL   MCH 31.5 26.6 - 33.0 pg   MCHC 32.9 31.5 - 35.7 g/dL   RDW 04.5 40.9 - 81.1 %   Platelets 188 150 - 450 x10E3/uL   Neutrophils 68 Not Estab. %   Lymphs 23 Not Estab. %   Monocytes 7 Not Estab. %   Eos 1 Not Estab. %   Basos 1 Not Estab. %   Neutrophils Absolute 2.8 1.4 - 7.0 x10E3/uL   Lymphocytes Absolute 1.0 0.7 - 3.1 x10E3/uL   Monocytes Absolute 0.3 0.1 - 0.9 x10E3/uL   EOS (ABSOLUTE) 0.1 0.0 - 0.4 x10E3/uL   Basophils Absolute 0.0 0.0 - 0.2 x10E3/uL   Immature Granulocytes 0 Not Estab. %   Immature Grans (Abs) 0.0 0.0 - 0.1 x10E3/uL  Comprehensive metabolic panel  Result Value Ref Range   Glucose 85 70 - 99 mg/dL   BUN 23 8 - 27 mg/dL   Creatinine, Ser 9.14 0.76 - 1.27 mg/dL   eGFR 67 >78 GN/FAO/1.30   BUN/Creatinine Ratio 19 10 - 24   Sodium 140 134 - 144 mmol/L   Potassium 4.2 3.5 - 5.2 mmol/L  Chloride 102 96 - 106 mmol/L   CO2 23 20 - 29 mmol/L   Calcium 9.9 8.6 - 10.2 mg/dL   Total Protein 6.5 6.0 - 8.5 g/dL   Albumin 4.5 3.9 - 4.9 g/dL   Globulin, Total 2.0 1.5 - 4.5 g/dL   Albumin/Globulin Ratio 2.3 (H) 1.2 - 2.2   Bilirubin Total 0.4 0.0 - 1.2 mg/dL   Alkaline Phosphatase 69 44 - 121 IU/L   AST 32 0 - 40 IU/L   ALT 19 0 - 44 IU/L  Lipid Panel w/o Chol/HDL Ratio  Result Value Ref Range   Cholesterol, Total 144 100 - 199 mg/dL   Triglycerides 161 0 - 149 mg/dL   HDL 42 >09 mg/dL   VLDL Cholesterol Cal 20 5 - 40 mg/dL   LDL Chol Calc (NIH) 82 0 - 99 mg/dL  PSA  Result Value Ref Range   Prostate Specific Ag, Serum 3.2 0.0 - 4.0 ng/mL  TSH  Result Value Ref Range   TSH 1.840 0.450 - 4.500 uIU/mL  Iron Binding Cap  (TIBC)([facility])  Result Value Ref Range   Total Iron Binding Capacity 247 (L) 250 - 450 ug/dL   UIBC 604 540 - 981 ug/dL   Iron 96 38 - 191 ug/dL   Iron Saturation 39 15 - 55 %  Ferritin  Result Value Ref Range   Ferritin 245 30 - 400 ng/mL      Assessment & Plan:   Problem List Items Addressed This Visit       Cardiovascular and Mediastinum   Benign essential hypertension - Primary    Chronic, ongoing with recent elevations and elevated in office today.  Recommend he monitor BP at least a few mornings a week at home and document.  DASH diet at home.  Will increase Lisinopril to 40 MG daily, alert provider if elevations consistently >130/80. Consider 12.5 MG HCTZ next visit if ongoing elevations, 25 MG caused hypotension.  Labs today: CBC, CMP, TSH, urine ALB.  Return in 4 weeks.       Relevant Medications   lisinopril (ZESTRIL) 40 MG tablet   Other Relevant Orders   Comprehensive metabolic panel   Mild mitral regurgitation    Followed by cardiology, continue this collaboration.      Relevant Medications   lisinopril (ZESTRIL) 40 MG tablet   Mild tricuspid regurgitation    Followed by cardiology, continue this collaboration.      Relevant Medications   lisinopril (ZESTRIL) 40 MG tablet     Other   Abdominal pain, LUQ    Acute and intermittent episodes with no other symptoms.  ?acute diverticulitis episodes, he eats lots of nuts and products with seeds.  Recommend focus on diet changes to see if resolution of discomfort.  Check labs today: CBC, CMP, UA.      Relevant Orders   Urinalysis, Routine w reflex microscopic   Low hemoglobin    Ongoing, mild since June 2021 -- normal iron level + negative FOBT past checks.  Is very active at baseline, suspect this may be reason for mild low levels.  Continue supplements at home and monitor.  If any significant decline will get into GI or hematology for further evaluation.  CBC, iron, ferritin today.      Relevant  Orders   CBC with Differential/Platelet   Iron Binding Cap (TIBC)([facility])   Ferritin   Mixed hyperlipidemia    Chronic, stable.  Continue current medication regimen and adjust as needed. Lipid panel  and CMP today.        Relevant Medications   lisinopril (ZESTRIL) 40 MG tablet   Other Relevant Orders   Comprehensive metabolic panel   Lipid Panel w/o Chol/HDL Ratio   Tinnitus of both ears    Present for many years with some worsening reporting and hearing changes.  Will place referral to ENT for further assessment.  No impacted cerumen on exam.      Relevant Orders   Ambulatory referral to ENT     Follow up plan: Return in about 4 weeks (around 06/14/2022) for HTN -- increased Lisinopril to 40 MG.

## 2022-05-17 NOTE — Assessment & Plan Note (Signed)
Chronic, stable.  Continue current medication regimen and adjust as needed.  Lipid panel and CMP today. 

## 2022-05-17 NOTE — Assessment & Plan Note (Signed)
Present for many years with some worsening reporting and hearing changes.  Will place referral to ENT for further assessment.  No impacted cerumen on exam.

## 2022-05-18 LAB — CBC WITH DIFFERENTIAL/PLATELET
Basophils Absolute: 0 10*3/uL (ref 0.0–0.2)
Basos: 1 %
EOS (ABSOLUTE): 0.1 10*3/uL (ref 0.0–0.4)
Eos: 2 %
Hematocrit: 36.8 % — ABNORMAL LOW (ref 37.5–51.0)
Hemoglobin: 12.3 g/dL — ABNORMAL LOW (ref 13.0–17.7)
Immature Grans (Abs): 0 10*3/uL (ref 0.0–0.1)
Immature Granulocytes: 0 %
Lymphocytes Absolute: 1.1 10*3/uL (ref 0.7–3.1)
Lymphs: 30 %
MCH: 32.5 pg (ref 26.6–33.0)
MCHC: 33.4 g/dL (ref 31.5–35.7)
MCV: 97 fL (ref 79–97)
Monocytes Absolute: 0.4 10*3/uL (ref 0.1–0.9)
Monocytes: 12 %
Neutrophils Absolute: 1.9 10*3/uL (ref 1.4–7.0)
Neutrophils: 55 %
Platelets: 222 10*3/uL (ref 150–450)
RBC: 3.78 x10E6/uL — ABNORMAL LOW (ref 4.14–5.80)
RDW: 13.1 % (ref 11.6–15.4)
WBC: 3.5 10*3/uL (ref 3.4–10.8)

## 2022-05-18 LAB — COMPREHENSIVE METABOLIC PANEL
ALT: 17 IU/L (ref 0–44)
AST: 37 IU/L (ref 0–40)
Albumin/Globulin Ratio: 2 (ref 1.2–2.2)
Albumin: 4.5 g/dL (ref 3.9–4.9)
Alkaline Phosphatase: 69 IU/L (ref 44–121)
BUN/Creatinine Ratio: 14 (ref 10–24)
BUN: 15 mg/dL (ref 8–27)
Bilirubin Total: <0.2 mg/dL (ref 0.0–1.2)
CO2: 23 mmol/L (ref 20–29)
Calcium: 9.5 mg/dL (ref 8.6–10.2)
Chloride: 104 mmol/L (ref 96–106)
Creatinine, Ser: 1.09 mg/dL (ref 0.76–1.27)
Globulin, Total: 2.2 g/dL (ref 1.5–4.5)
Glucose: 90 mg/dL (ref 70–99)
Potassium: 4.6 mmol/L (ref 3.5–5.2)
Sodium: 140 mmol/L (ref 134–144)
Total Protein: 6.7 g/dL (ref 6.0–8.5)
eGFR: 75 mL/min/{1.73_m2} (ref >59)

## 2022-05-18 LAB — IRON AND TIBC
Iron Saturation: 38 % (ref 15–55)
Iron: 109 ug/dL (ref 38–169)
Total Iron Binding Capacity: 287 ug/dL (ref 250–450)
UIBC: 178 ug/dL (ref 111–343)

## 2022-05-18 LAB — LIPID PANEL W/O CHOL/HDL RATIO
Cholesterol, Total: 165 mg/dL (ref 100–199)
HDL: 64 mg/dL (ref >39)
LDL Chol Calc (NIH): 78 mg/dL (ref 0–99)
Triglycerides: 132 mg/dL (ref 0–149)
VLDL Cholesterol Cal: 23 mg/dL (ref 5–40)

## 2022-05-18 LAB — FERRITIN: Ferritin: 579 ng/mL — ABNORMAL HIGH (ref 30–400)

## 2022-05-18 NOTE — Progress Notes (Signed)
Contacted via MyChart   Good evening Warren Richardson, your labs have returned: - CBC continues to show mild anemia -- but iron and ferritin remain stable, we will continue to monitor. - Remainder of labs are stable.  Great news!!  Any questions? Keep being amazing!!  Thank you for allowing me to participate in your care.  I appreciate you. Kindest regards, Yasenia Reedy

## 2022-06-11 NOTE — Patient Instructions (Signed)

## 2022-06-15 ENCOUNTER — Ambulatory Visit (INDEPENDENT_AMBULATORY_CARE_PROVIDER_SITE_OTHER): Payer: Managed Care, Other (non HMO) | Admitting: Nurse Practitioner

## 2022-06-15 ENCOUNTER — Encounter: Payer: Self-pay | Admitting: Nurse Practitioner

## 2022-06-15 VITALS — BP 131/73 | HR 61 | Temp 98.2°F | Wt 152.1 lb

## 2022-06-15 DIAGNOSIS — I10 Essential (primary) hypertension: Secondary | ICD-10-CM | POA: Diagnosis not present

## 2022-06-15 NOTE — Assessment & Plan Note (Signed)
Chronic, ongoing.  BP trending down with increase in Lisinopril to 40 MG and tolerating change.  Recommend he monitor BP at least a few mornings a week at home and document.  DASH diet at home.  Continue Lisinopril 40 MG daily, alert provider if elevations consistently >130/80. Consider 12.5 MG HCTZ next visit if ongoing elevations, 25 MG caused hypotension.  Labs today: up to date.  Return in July for physical.

## 2022-06-15 NOTE — Progress Notes (Signed)
BP 131/73   Pulse 61   Temp 98.2 F (36.8 C) (Oral)   Wt 152 lb 1.6 oz (69 kg)   SpO2 99%   BMI 24.36 kg/m    Subjective:    Patient ID: Warren Richardson, male    DOB: 1956/04/04, 67 y.o.   MRN: [id number]  HPI: Warren Richardson is a 67 y.o. male  Chief Complaint  Patient presents with   Hypertension   HYPERTENSION  On 05/17/22 increased Lisinopril to 40 MG daily due to elevations in BP. Continues to work out at gym. Hypertension status: stable  Satisfied with current treatment? yes Duration of hypertension: chronic BP monitoring frequency:  daily BP range: 107/65 to 151/78 -- averaging <130/80, only 3 above goal since increase BP medication side effects:  no Medication compliance: good compliance Aspirin: no Recurrent headaches: no Visual changes: no Palpitations: no Dyspnea: no Chest pain: no Lower extremity edema: no Dizzy/lightheaded: no   Relevant past medical, surgical, family and social history reviewed and updated as indicated. Interim medical history since our last visit reviewed. Allergies and medications reviewed and updated.  Review of Systems  Constitutional:  Negative for activity change, diaphoresis, fatigue and fever.  Respiratory:  Negative for cough, chest tightness, shortness of breath and wheezing.   Cardiovascular:  Negative for chest pain, palpitations and leg swelling.  Gastrointestinal: Negative.   Endocrine: Negative for cold intolerance and heat intolerance.  Neurological: Negative.   Psychiatric/Behavioral: Negative.      Per HPI unless specifically indicated above     Objective:    BP 131/73   Pulse 61   Temp 98.2 F (36.8 C) (Oral)   Wt 152 lb 1.6 oz (69 kg)   SpO2 99%   BMI 24.36 kg/m   Wt Readings from Last 3 Encounters:  06/15/22 152 lb 1.6 oz (69 kg)  05/17/22 150 lb 14.4 oz (68.4 kg)  11/14/21 147 lb 12.8 oz (67 kg)    Physical Exam Vitals and nursing note reviewed.  Constitutional:      General: He is  awake. He is not in acute distress.    Appearance: He is well-developed and well-groomed. He is not ill-appearing or toxic-appearing.  HENT:     Head: Normocephalic.     Right Ear: Hearing and external ear normal.     Left Ear: Hearing and external ear normal.  Eyes:     General: Lids are normal.     Extraocular Movements: Extraocular movements intact.     Conjunctiva/sclera: Conjunctivae normal.  Neck:     Thyroid: No thyromegaly.     Vascular: No carotid bruit.  Cardiovascular:     Rate and Rhythm: Normal rate and regular rhythm.     Heart sounds: Normal heart sounds.  Pulmonary:     Effort: No accessory muscle usage or respiratory distress.     Breath sounds: Normal breath sounds.  Abdominal:     General: Bowel sounds are normal. There is no distension.     Palpations: Abdomen is soft.     Tenderness: There is no abdominal tenderness.  Musculoskeletal:     Cervical back: Full passive range of motion without pain.     Right lower leg: No edema.     Left lower leg: No edema.  Skin:    General: Skin is warm.     Capillary Refill: Capillary refill takes less than 2 seconds.  Neurological:     Mental Status: He is alert and oriented to person,  place, and time.     Deep Tendon Reflexes: Reflexes are normal and symmetric.     Reflex Scores:      Brachioradialis reflexes are 2+ on the right side and 2+ on the left side.      Patellar reflexes are 2+ on the right side and 2+ on the left side. Psychiatric:        Attention and Perception: Attention normal.        Mood and Affect: Mood normal.        Speech: Speech normal.        Behavior: Behavior normal. Behavior is cooperative.        Thought Content: Thought content normal.    Results for orders placed or performed in visit on 05/17/22  Microscopic Examination   Urine  Result Value Ref Range   WBC, UA 0-5 0 - 5 /hpf   RBC, Urine 0-2 0 - 2 /hpf   Epithelial Cells (non renal) 0-10 0 - 10 /hpf   Bacteria, UA None seen None  seen/Few  CBC with Differential/Platelet  Result Value Ref Range   WBC 3.5 3.4 - 10.8 x10E3/uL   RBC 3.78 (L) 4.14 - 5.80 x10E6/uL   Hemoglobin 12.3 (L) 13.0 - 17.7 g/dL   Hematocrit 65.7 (L) 84.6 - 51.0 %   MCV 97 79 - 97 fL   MCH 32.5 26.6 - 33.0 pg   MCHC 33.4 31.5 - 35.7 g/dL   RDW 96.2 95.2 - 84.1 %   Platelets 222 150 - 450 x10E3/uL   Neutrophils 55 Not Estab. %   Lymphs 30 Not Estab. %   Monocytes 12 Not Estab. %   Eos 2 Not Estab. %   Basos 1 Not Estab. %   Neutrophils Absolute 1.9 1.4 - 7.0 x10E3/uL   Lymphocytes Absolute 1.1 0.7 - 3.1 x10E3/uL   Monocytes Absolute 0.4 0.1 - 0.9 x10E3/uL   EOS (ABSOLUTE) 0.1 0.0 - 0.4 x10E3/uL   Basophils Absolute 0.0 0.0 - 0.2 x10E3/uL   Immature Granulocytes 0 Not Estab. %   Immature Grans (Abs) 0.0 0.0 - 0.1 x10E3/uL  Comprehensive metabolic panel  Result Value Ref Range   Glucose 90 70 - 99 mg/dL   BUN 15 8 - 27 mg/dL   Creatinine, Ser 3.24 0.76 - 1.27 mg/dL   eGFR 75 >40 NU/UVO/5.36   BUN/Creatinine Ratio 14 10 - 24   Sodium 140 134 - 144 mmol/L   Potassium 4.6 3.5 - 5.2 mmol/L   Chloride 104 96 - 106 mmol/L   CO2 23 20 - 29 mmol/L   Calcium 9.5 8.6 - 10.2 mg/dL   Total Protein 6.7 6.0 - 8.5 g/dL   Albumin 4.5 3.9 - 4.9 g/dL   Globulin, Total 2.2 1.5 - 4.5 g/dL   Albumin/Globulin Ratio 2.0 1.2 - 2.2   Bilirubin Total <0.2 0.0 - 1.2 mg/dL   Alkaline Phosphatase 69 44 - 121 IU/L   AST 37 0 - 40 IU/L   ALT 17 0 - 44 IU/L  Lipid Panel w/o Chol/HDL Ratio  Result Value Ref Range   Cholesterol, Total 165 100 - 199 mg/dL   Triglycerides 644 0 - 149 mg/dL   HDL 64 >03 mg/dL   VLDL Cholesterol Cal 23 5 - 40 mg/dL   LDL Chol Calc (NIH) 78 0 - 99 mg/dL  Urinalysis, Routine w reflex microscopic  Result Value Ref Range   Specific Gravity, UA 1.015 1.005 - 1.030   pH, UA 6.5 5.0 -  7.5   Color, UA Yellow Yellow   Appearance Ur Clear Clear   Leukocytes,UA Trace (A) Negative   Protein,UA Negative Negative/Trace   Glucose, UA  Negative Negative   Ketones, UA Trace (A) Negative   RBC, UA Negative Negative   Bilirubin, UA Negative Negative   Urobilinogen, Ur 0.2 0.2 - 1.0 mg/dL   Nitrite, UA Negative Negative   Microscopic Examination See below:   Iron Binding Cap (TIBC)([facility])  Result Value Ref Range   Total Iron Binding Capacity 287 250 - 450 ug/dL   UIBC 161 096 - 045 ug/dL   Iron 409 38 - 811 ug/dL   Iron Saturation 38 15 - 55 %  Ferritin  Result Value Ref Range   Ferritin 579 (H) 30 - 400 ng/mL      Assessment & Plan:   Problem List Items Addressed This Visit       Cardiovascular and Mediastinum   Benign essential hypertension - Primary    Chronic, ongoing.  BP trending down with increase in Lisinopril to 40 MG and tolerating change.  Recommend he monitor BP at least a few mornings a week at home and document.  DASH diet at home.  Continue Lisinopril 40 MG daily, alert provider if elevations consistently >130/80. Consider 12.5 MG HCTZ next visit if ongoing elevations, 25 MG caused hypotension.  Labs today: up to date.  Return in July for physical.         Follow up plan: Return in about 22 weeks (around 11/16/2022) for Annual physical.

## 2022-08-03 ENCOUNTER — Other Ambulatory Visit: Payer: Self-pay | Admitting: Nurse Practitioner

## 2022-08-03 NOTE — Telephone Encounter (Signed)
Requested Prescriptions  Pending Prescriptions Disp Refills   rosuvastatin (CRESTOR) 40 MG tablet [Pharmacy Med Name: ROSUVASTATIN CALCIUM 40 MG TAB] 90 tablet 3    Sig: TAKE 1 TABLET BY MOUTH EVERY DAY     Cardiovascular:  Antilipid - Statins 2 Failed - 08/03/2022  2:11 AM      Failed - Lipid Panel in normal range within the last 12 months    Cholesterol, Total  Date Value Ref Range Status  05/17/2022 165 100 - 199 mg/dL Final   LDL Chol Calc (NIH)  Date Value Ref Range Status  05/17/2022 78 0 - 99 mg/dL Final   HDL  Date Value Ref Range Status  05/17/2022 64 >39 mg/dL Final   Triglycerides  Date Value Ref Range Status  05/17/2022 132 0 - 149 mg/dL Final         Passed - Cr in normal range and within 360 days    Creatinine, Ser  Date Value Ref Range Status  05/17/2022 1.09 0.76 - 1.27 mg/dL Final         Passed - Patient is not pregnant      Passed - Valid encounter within last 12 months    Recent Outpatient Visits           1 month ago Benign essential hypertension   [place], Elberta T, NP   2 months ago Benign essential hypertension   [place], Lantry T, NP   8 months ago Benign essential hypertension   [place], [place] T, NP   1 year ago Benign essential hypertension   [place], Henrine Screws T, NP   1 year ago Benign essential hypertension   [place], Scheryl Darter, NP       Future Appointments             In 3 months Cann[place], Barbaraann Faster, NP [place]

## 2022-11-12 NOTE — Patient Instructions (Signed)
Be Involved in Caring For Your Health:  Taking Medications When medications are taken as directed, they can greatly improve your health. But if they are not taken as prescribed, they may not work. In some cases, not taking them correctly can be harmful. To help ensure your treatment remains effective and safe, understand your medications and how to take them. Bring your medications to each visit for review by your provider.  Your lab results, notes, and after visit summary will be available on My Chart. We strongly encourage you to use this feature. If lab results are abnormal the clinic will contact you with the appropriate steps. If the clinic does not contact you assume the results are satisfactory. You can always view your results on My Chart. If you have questions regarding your health or results, please contact the clinic during office hours. You can also ask questions on My Chart.  We at [facility] are grateful that you chose [place] to provide your care. We strive to provide evidence-based and compassionate care and are always looking for feedback. If you get a survey from the clinic please complete this so we can hear your opinions.  DASH Eating Plan DASH stands for Dietary Approaches to Stop Hypertension. The DASH eating plan is a healthy eating plan that has been shown to: Lower high blood pressure (hypertension). Reduce your risk for type 2 diabetes, heart disease, and stroke. Help with weight loss. What are tips for following this plan? Reading food labels Check food labels for the amount of salt (sodium) per serving. Choose foods with less than 5 percent of the Daily Value (DV) of sodium. In general, foods with less than 300 milligrams (mg) of sodium per serving fit into this eating plan. To find whole grains, look for the word "whole" as the first word in the ingredient list. Shopping Buy products labeled as "low-sodium" or "no salt added." Buy fresh foods. Avoid canned  foods and pre-made or frozen meals. Cooking Try not to add salt when you cook. Use salt-free seasonings or herbs instead of table salt or sea salt. Check with your health care provider or pharmacist before using salt substitutes. Do not fry foods. Cook foods in healthy ways, such as baking, boiling, grilling, roasting, or broiling. Cook using oils that are good for your heart. These include olive, canola, avocado, soybean, and sunflower oil. Meal planning  Eat a balanced diet. This should include: 4 or more servings of fruits and 4 or more servings of vegetables each day. Try to fill half of your plate with fruits and vegetables. 6-8 servings of whole grains each day. 6 or less servings of lean meat, poultry, or fish each day. 1 oz is 1 serving. A 3 oz (85 g) serving of meat is about the same size as the palm of your hand. One egg is 1 oz (28 g). 2-3 servings of low-fat dairy each day. One serving is 1 cup (237 mL). 1 serving of nuts, seeds, or beans 5 times each week. 2-3 servings of heart-healthy fats. Healthy fats called omega-3 fatty acids are found in foods such as walnuts, flaxseeds, fortified milks, and eggs. These fats are also found in cold-water fish, such as sardines, salmon, and mackerel. Limit how much you eat of: Canned or prepackaged foods. Food that is high in trans fat, such as fried foods. Food that is high in saturated fat, such as fatty meat. Desserts and other sweets, sugary drinks, and other foods with added sugar. Full-fat  dairy products. Do not salt foods before eating. Do not eat more than 4 egg yolks a week. Try to eat at least 2 vegetarian meals a week. Eat more home-cooked food and less restaurant, buffet, and fast food. Lifestyle When eating at a restaurant, ask if your food can be made with less salt or no salt. If you drink alcohol: Limit how much you have to: 0-1 drink a day if you are male. 0-2 drinks a day if you are male. Know how much alcohol is in  your drink. In the [place] one drink is one 12 oz bottle of beer (355 mL), one 5 oz glass of wine (148 mL), or one 1 oz glass of hard liquor (44 mL). General information Avoid eating more than 2,300 mg of salt a day. If you have hypertension, you may need to reduce your sodium intake to 1,500 mg a day. Work with your provider to stay at a healthy body weight or lose weight. Ask what the best weight range is for you. On most days of the week, get at least 30 minutes of exercise that causes your heart to beat faster. This may include walking, swimming, or biking. Work with your provider or dietitian to adjust your eating plan to meet your specific calorie needs. What foods should I eat? Fruits All fresh, dried, or frozen fruit. Canned fruits that are in their natural juice and do not have sugar added to them. Vegetables Fresh or frozen vegetables that are raw, steamed, roasted, or grilled. Low-sodium or reduced-sodium tomato and vegetable juice. Low-sodium or reduced-sodium tomato sauce and tomato paste. Low-sodium or reduced-sodium canned vegetables. Grains Whole-grain or whole-wheat bread. Whole-grain or whole-wheat pasta. Brown rice. Orpah Cobb. Bulgur. Whole-grain and low-sodium cereals. Pita bread. Low-fat, low-sodium crackers. Whole-wheat flour tortillas. Meats and other proteins Skinless chicken or [place]. Ground chicken or [place]. Pork with fat trimmed off. Fish and seafood. Egg whites. Dried beans, peas, or lentils. Unsalted nuts, nut butters, and seeds. Unsalted canned beans. Lean cuts of beef with fat trimmed off. Low-sodium, lean precooked or cured meat, such as sausages or meat loaves. Dairy Low-fat (1%) or fat-free (skim) milk. Reduced-fat, low-fat, or fat-free cheeses. Nonfat, low-sodium ricotta or cottage cheese. Low-fat or nonfat yogurt. Low-fat, low-sodium cheese. Fats and oils Soft margarine without trans fats. Vegetable oil. Reduced-fat, low-fat, or light mayonnaise and salad  dressings (reduced-sodium). Canola, safflower, olive, avocado, soybean, and sunflower oils. Avocado. Seasonings and condiments Herbs. Spices. Seasoning mixes without salt. Other foods Unsalted popcorn and pretzels. Fat-free sweets. The items listed above may not be all the foods and drinks you can have. Talk to a dietitian to learn more. What foods should I avoid? Fruits Canned fruit in a light or heavy syrup. Fried fruit. Fruit in cream or butter sauce. Vegetables Creamed or fried vegetables. Vegetables in a cheese sauce. Regular canned vegetables that are not marked as low-sodium or reduced-sodium. Regular canned tomato sauce and paste that are not marked as low-sodium or reduced-sodium. Regular tomato and vegetable juices that are not marked as low-sodium or reduced-sodium. Rosita Fire. Olives. Grains Baked goods made with fat, such as croissants, muffins, or some breads. Dry pasta or rice meal packs. Meats and other proteins Fatty cuts of meat. Ribs. Fried meat. Tomasa Blase. Bologna, salami, and other precooked or cured meats, such as sausages or meat loaves, that are not lean and low in sodium. Fat from the back of a pig (fatback). Bratwurst. Salted nuts and seeds. Canned beans with added salt. Canned  or smoked fish. Whole eggs or egg yolks. Chicken or [place] with skin. Dairy Whole or 2% milk, cream, and half-and-half. Whole or full-fat cream cheese. Whole-fat or sweetened yogurt. Full-fat cheese. Nondairy creamers. Whipped toppings. Processed cheese and cheese spreads. Fats and oils Butter. Stick margarine. Lard. Shortening. Ghee. Bacon fat. Tropical oils, such as coconut, palm kernel, or palm oil. Seasonings and condiments Onion salt, garlic salt, seasoned salt, table salt, and sea salt. Worcestershire sauce. Tartar sauce. Barbecue sauce. Teriyaki sauce. Soy sauce, including reduced-sodium soy sauce. Steak sauce. Canned and packaged gravies. Fish sauce. Oyster sauce. Cocktail sauce. Store-bought  horseradish. Ketchup. Mustard. Meat flavorings and tenderizers. Bouillon cubes. Hot sauces. Pre-made or packaged marinades. Pre-made or packaged taco seasonings. Relishes. Regular salad dressings. Other foods Salted popcorn and pretzels. The items listed above may not be all the foods and drinks you should avoid. Talk to a dietitian to learn more. Where to find more information National Heart, Lung, and Blood Institute (NHLBI): BuffaloDryCleaner.gl American Heart Association (AHA): heart.org Academy of Nutrition and Dietetics: eatright.org National Kidney Foundation (NKF): kidney.org This information is not intended to replace advice given to you by your health care provider. Make sure you discuss any questions you have with your health care provider. Document Revised: 05/11/2022 Document Reviewed: 05/11/2022 Elsevier Patient Education  2024 [employer].

## 2022-11-16 ENCOUNTER — Ambulatory Visit: Payer: Managed Care, Other (non HMO) | Admitting: Nurse Practitioner

## 2022-11-16 ENCOUNTER — Encounter: Payer: Self-pay | Admitting: Nurse Practitioner

## 2022-11-16 VITALS — BP 133/78 | HR 68 | Temp 97.8°F | Ht 66.26 in | Wt 146.6 lb

## 2022-11-16 DIAGNOSIS — D649 Anemia, unspecified: Secondary | ICD-10-CM

## 2022-11-16 DIAGNOSIS — Z23 Encounter for immunization: Secondary | ICD-10-CM

## 2022-11-16 DIAGNOSIS — I340 Nonrheumatic mitral (valve) insufficiency: Secondary | ICD-10-CM

## 2022-11-16 DIAGNOSIS — E782 Mixed hyperlipidemia: Secondary | ICD-10-CM | POA: Diagnosis not present

## 2022-11-16 DIAGNOSIS — I10 Essential (primary) hypertension: Secondary | ICD-10-CM | POA: Diagnosis not present

## 2022-11-16 DIAGNOSIS — I071 Rheumatic tricuspid insufficiency: Secondary | ICD-10-CM

## 2022-11-16 DIAGNOSIS — Z0000 Encounter for general adult medical examination without abnormal findings: Secondary | ICD-10-CM

## 2022-11-16 DIAGNOSIS — N400 Enlarged prostate without lower urinary tract symptoms: Secondary | ICD-10-CM

## 2022-11-16 NOTE — Progress Notes (Signed)
BP 133/78   Pulse 68   Temp 97.8 F (36.6 C) (Oral)   Ht 5' 6.26" (1.683 m)   Wt 146 lb 9.6 oz (66.5 kg)   SpO2 98%   BMI 23.48 kg/m    Subjective:    Patient ID: Warren Richardson, male    DOB: January 29, 1956, 67 y.o.   MRN: [id number]  HPI: Warren Richardson is a 67 y.o. male presenting on 11/16/2022 for comprehensive medical examination. Current medical complaints include: none  He currently lives with: self Interim Problems from his last visit: none  HYPERTENSION / HYPERLIPIDEMIA Continues on Lisinopril and Crestor + ASA.  History of lower hemoglobin and iron on labs = he is very active at baseline, is taking iron daily.  Sees cardiology annually, last saw 02/20/22, due to mitral and tricuspid regurgitation. Last echo was 2019 - EF 55%.   Satisfied with current treatment? yes Duration of hypertension: chronic BP monitoring frequency: a few times a month BP range: 109/63 to 149/88 -- only two with elevation, average 110-120/70 BP medication side effects: no Duration of hyperlipidemia: chronic Cholesterol medication side effects: no Cholesterol supplements: none Medication compliance: good compliance Aspirin: yes Recent stressors: no Recurrent headaches: no Visual changes: no Palpitations: no Dyspnea: no Chest pain: no Lower extremity edema: no Dizzy/lightheaded: no   Functional Status Survey: Is the patient deaf or have difficulty hearing?: No Does the patient have difficulty seeing, even when wearing glasses/contacts?: No Does the patient have difficulty concentrating, remembering, or making decisions?: No Does the patient have difficulty walking or climbing stairs?: No Does the patient have difficulty dressing or bathing?: No Does the patient have difficulty doing errands alone such as visiting a doctor's office or shopping?: No  FALL RISK:    11/16/2022    8:25 AM 06/15/2022    9:30 AM 05/17/2022    8:14 AM 11/14/2021    8:08 AM 11/12/2020    8:25 AM  Fall Risk    Falls in the past year? 0 0 0 0 0  Number falls in past yr: 0 0 0 0 0  Injury with Fall? 0 0 0 0 0  Risk for fall due to : No Fall Risks No Fall Risks No Fall Risks No Fall Risks No Fall Risks  Follow up Falls evaluation completed Falls evaluation completed  Falls evaluation completed Falls evaluation completed    Depression Screen    11/16/2022    8:26 AM 06/15/2022    9:31 AM 05/17/2022    8:14 AM 11/14/2021    8:11 AM 05/16/2021    8:09 AM  Depression screen PHQ 2/9  Decreased Interest 0 0 0 0 0  Down, Depressed, Hopeless 0 0 0 0 0  PHQ - 2 Score 0 0 0 0 0  Altered sleeping 0 0 0 0 0  Tired, decreased energy 0 0 0 0 0  Change in appetite 0 0 0 0 0  Feeling bad or failure about yourself  0 0 0 0 0  Trouble concentrating 0 0 0 0 0  Moving slowly or fidgety/restless 0 0 0 0 0  Suicidal thoughts 0 0 0 0 0  PHQ-9 Score 0 0 0 0 0  Difficult doing work/chores Not difficult at all Not difficult at all Not difficult at all Not difficult at all       11/16/2022    8:26 AM 06/15/2022    9:31 AM 05/17/2022    8:14 AM 11/14/2021    8:12  AM  GAD 7 : Generalized Anxiety Score  Nervous, Anxious, on Edge 0 0 0 0  Control/stop worrying 0 0 0 0  Worry too much - different things 0 0 0 0  Trouble relaxing 0 0 0 0  Restless 0 0 0 0  Easily annoyed or irritable 0 0 0 0  Afraid - awful might happen 0 0 0 0  Total GAD 7 Score 0 0 0 0  Anxiety Difficulty Not difficult at all Not difficult at all Not difficult at all Not difficult at all      Past Medical History:  Past Medical History:  Diagnosis Date   Hyperlipidemia    Hypertension     Surgical History:  History reviewed. No pertinent surgical history.  Medications:  Current Outpatient Medications on File Prior to Visit  Medication Sig   aspirin 81 MG chewable tablet Chew 81 mg by mouth daily.   BIOTIN PO Take 250 mg by mouth daily.   BLACK ELDERBERRY PO Take by mouth.   Ginger, Zingiber officinalis, (GINGER ROOT PO) 1 Dose once  daily   glucosamine-chondroitin 500-400 MG tablet Take 1 tablet by mouth 2 (two) times daily.   lisinopril (ZESTRIL) 40 MG tablet Take 1 tablet (40 mg total) by mouth daily.   Multiple Vitamin (MULTI-VITAMINS) TABS Take 1 tablet by mouth daily.   Omega-3 Fatty Acids (FISH OIL) 1000 MG CAPS Take 1,000 mg by mouth 2 (two) times daily.   rosuvastatin (CRESTOR) 40 MG tablet TAKE 1 TABLET BY MOUTH EVERY DAY   Saw Palmetto, Serenoa repens, (SAW PALMETTO PO) Take by mouth.   No current facility-administered medications on file prior to visit.    Allergies:  Allergies  Allergen Reactions   Atorvastatin Other (See Comments)    Increased liver enzymes    Social History:  Social History   Socioeconomic History   Marital status: Single    Spouse name: Not on file   Number of children: Not on file   Years of education: Not on file   Highest education level: Not on file  Occupational History   Not on file  Tobacco Use   Smoking status: Never   Smokeless tobacco: Never  Vaping Use   Vaping status: Never Used  Substance and Sexual Activity   Alcohol use: Yes    Alcohol/week: 1.0 - 2.0 standard drink of alcohol    Types: 1 - 2 Cans of beer per week   Drug use: No   Sexual activity: Yes  Other Topics Concern   Not on file  Social History Narrative   Not on file   Social Determinants of Health   Financial Resource Strain: Low Risk  (05/16/2021)   Overall Financial Resource Strain (CARDIA)    Difficulty of Paying Living Expenses: Not hard at all  Food Insecurity: No Food Insecurity (05/16/2021)   Hunger Vital Sign    Worried About Running Out of Food in the Last Year: Never true    Ran Out of Food in the Last Year: Never true  Transportation Needs: No Transportation Needs (05/16/2021)   PRAPARE - Administrator, Civil Service (Medical): No    Lack of Transportation (Non-Medical): No  Physical Activity: Sufficiently Active (05/16/2021)   Exercise Vital Sign    Days of  Exercise per Week: 5 days    Minutes of Exercise per Session: 60 min  Stress: No Stress Concern Present (05/16/2021)   [employer] of Occupational Health - Occupational Stress Questionnaire  Feeling of Stress : Only a little  Social Connections: Socially Isolated (05/16/2021)   Social Connection and Isolation Panel [NHANES]    Frequency of Communication with Friends and Family: More than three times a week    Frequency of Social Gatherings with Friends and Family: More than three times a week    Attends Religious Services: Never    Database administrator or Organizations: No    Attends Banker Meetings: Never    Marital Status: Divorced  Catering manager Violence: Not At Risk (05/16/2021)   Humiliation, Afraid, Rape, and Kick questionnaire    Fear of Current or Ex-Partner: No    Emotionally Abused: No    Physically Abused: No    Sexually Abused: No   Social History   Tobacco Use  Smoking Status Never  Smokeless Tobacco Never   Social History   Substance and Sexual Activity  Alcohol Use Yes   Alcohol/week: 1.0 - 2.0 standard drink of alcohol   Types: 1 - 2 Cans of beer per week    Family History:  Family History  Problem Relation Age of Onset   Arthritis Mother    Stroke Mother    Dementia Mother    Cancer Father        prostate   Diabetes Father    Hyperlipidemia Father    Hypertension Father    Cancer Sister        breast    Past medical history, surgical history, medications, allergies, family history and social history reviewed with patient today and changes made to appropriate areas of the chart.   ROS All other ROS negative except what is listed above and in the HPI.      Objective:    BP 133/78   Pulse 68   Temp 97.8 F (36.6 C) (Oral)   Ht 5' 6.26" (1.683 m)   Wt 146 lb 9.6 oz (66.5 kg)   SpO2 98%   BMI 23.48 kg/m   Wt Readings from Last 3 Encounters:  11/16/22 146 lb 9.6 oz (66.5 kg)  06/15/22 152 lb 1.6 oz (69 kg)  05/17/22  150 lb 14.4 oz (68.4 kg)    Physical Exam Vitals and nursing note reviewed.  Constitutional:      General: He is awake. He is not in acute distress.    Appearance: He is well-developed and well-groomed. He is not ill-appearing or toxic-appearing.  HENT:     Head: Normocephalic and atraumatic.     Right Ear: Hearing, tympanic membrane, ear canal and external ear normal. No drainage.     Left Ear: Hearing, tympanic membrane, ear canal and external ear normal. No drainage.     Nose: Nose normal.     Mouth/Throat:     Pharynx: Uvula midline.  Eyes:     General: Lids are normal.        Right eye: No discharge.        Left eye: No discharge.     Extraocular Movements: Extraocular movements intact.     Conjunctiva/sclera: Conjunctivae normal.     Pupils: Pupils are equal, round, and reactive to light.     Visual Fields: Right eye visual fields normal and left eye visual fields normal.  Neck:     Thyroid: No thyromegaly.     Vascular: No carotid bruit or JVD.     Trachea: Trachea normal.  Cardiovascular:     Rate and Rhythm: Normal rate and regular rhythm.  Heart sounds: Normal heart sounds, S1 normal and S2 normal. No murmur heard.    No gallop.  Pulmonary:     Effort: Pulmonary effort is normal. No accessory muscle usage or respiratory distress.     Breath sounds: Normal breath sounds.  Abdominal:     General: Bowel sounds are normal.     Palpations: Abdomen is soft. There is no hepatomegaly or splenomegaly.     Tenderness: There is no abdominal tenderness.  Musculoskeletal:        General: Normal range of motion.     Cervical back: Normal range of motion and neck supple.     Right lower leg: No edema.     Left lower leg: No edema.  Lymphadenopathy:     Head:     Right side of head: No submental, submandibular, tonsillar, preauricular or posterior auricular adenopathy.     Left side of head: No submental, submandibular, tonsillar, preauricular or posterior auricular  adenopathy.     Cervical: No cervical adenopathy.  Skin:    General: Skin is warm and dry.     Capillary Refill: Capillary refill takes less than 2 seconds.     Findings: No rash.  Neurological:     Mental Status: He is alert and oriented to person, place, and time.     Gait: Gait is intact.     Deep Tendon Reflexes: Reflexes are normal and symmetric.     Reflex Scores:      Brachioradialis reflexes are 2+ on the right side and 2+ on the left side.      Patellar reflexes are 2+ on the right side and 2+ on the left side. Psychiatric:        Attention and Perception: Attention normal.        Mood and Affect: Mood normal.        Speech: Speech normal.        Behavior: Behavior normal. Behavior is cooperative.        Thought Content: Thought content normal.        Cognition and Memory: Cognition normal.     Results for orders placed or performed in visit on 05/17/22  Microscopic Examination   Urine  Result Value Ref Range   WBC, UA 0-5 0 - 5 /hpf   RBC, Urine 0-2 0 - 2 /hpf   Epithelial Cells (non renal) 0-10 0 - 10 /hpf   Bacteria, UA None seen None seen/Few  CBC with Differential/Platelet  Result Value Ref Range   WBC 3.5 3.4 - 10.8 x10E3/uL   RBC 3.78 (L) 4.14 - 5.80 x10E6/uL   Hemoglobin 12.3 (L) 13.0 - 17.7 g/dL   Hematocrit 82.9 (L) 56.2 - 51.0 %   MCV 97 79 - 97 fL   MCH 32.5 26.6 - 33.0 pg   MCHC 33.4 31.5 - 35.7 g/dL   RDW 13.0 86.5 - 78.4 %   Platelets 222 150 - 450 x10E3/uL   Neutrophils 55 Not Estab. %   Lymphs 30 Not Estab. %   Monocytes 12 Not Estab. %   Eos 2 Not Estab. %   Basos 1 Not Estab. %   Neutrophils Absolute 1.9 1.4 - 7.0 x10E3/uL   Lymphocytes Absolute 1.1 0.7 - 3.1 x10E3/uL   Monocytes Absolute 0.4 0.1 - 0.9 x10E3/uL   EOS (ABSOLUTE) 0.1 0.0 - 0.4 x10E3/uL   Basophils Absolute 0.0 0.0 - 0.2 x10E3/uL   Immature Granulocytes 0 Not Estab. %   Immature Grans (Abs) 0.0  0.0 - 0.1 x10E3/uL  Comprehensive metabolic panel  Result Value Ref Range    Glucose 90 70 - 99 mg/dL   BUN 15 8 - 27 mg/dL   Creatinine, Ser 4.09 0.76 - 1.27 mg/dL   eGFR 75 >81 XB/JYN/8.29   BUN/Creatinine Ratio 14 10 - 24   Sodium 140 134 - 144 mmol/L   Potassium 4.6 3.5 - 5.2 mmol/L   Chloride 104 96 - 106 mmol/L   CO2 23 20 - 29 mmol/L   Calcium 9.5 8.6 - 10.2 mg/dL   Total Protein 6.7 6.0 - 8.5 g/dL   Albumin 4.5 3.9 - 4.9 g/dL   Globulin, Total 2.2 1.5 - 4.5 g/dL   Albumin/Globulin Ratio 2.0 1.2 - 2.2   Bilirubin Total <0.2 0.0 - 1.2 mg/dL   Alkaline Phosphatase 69 44 - 121 IU/L   AST 37 0 - 40 IU/L   ALT 17 0 - 44 IU/L  Lipid Panel w/o Chol/HDL Ratio  Result Value Ref Range   Cholesterol, Total 165 100 - 199 mg/dL   Triglycerides 562 0 - 149 mg/dL   HDL 64 >13 mg/dL   VLDL Cholesterol Cal 23 5 - 40 mg/dL   LDL Chol Calc (NIH) 78 0 - 99 mg/dL  Urinalysis, Routine w reflex microscopic  Result Value Ref Range   Specific Gravity, UA 1.015 1.005 - 1.030   pH, UA 6.5 5.0 - 7.5   Color, UA Yellow Yellow   Appearance Ur Clear Clear   Leukocytes,UA Trace (A) Negative   Protein,UA Negative Negative/Trace   Glucose, UA Negative Negative   Ketones, UA Trace (A) Negative   RBC, UA Negative Negative   Bilirubin, UA Negative Negative   Urobilinogen, Ur 0.2 0.2 - 1.0 mg/dL   Nitrite, UA Negative Negative   Microscopic Examination See below:   Iron Binding Cap (TIBC)([facility])  Result Value Ref Range   Total Iron Binding Capacity 287 250 - 450 ug/dL   UIBC 086 578 - 469 ug/dL   Iron 629 38 - 528 ug/dL   Iron Saturation 38 15 - 55 %  Ferritin  Result Value Ref Range   Ferritin 579 (H) 30 - 400 ng/mL      Assessment & Plan:   Problem List Items Addressed This Visit       Cardiovascular and Mediastinum   Benign essential hypertension - Primary    Chronic, stable.  BP at goal in office and on average at home.  Recommend he monitor BP at least a few mornings a week at home and document.  DASH diet at home.  Continue Lisinopril 40 MG daily,  alert provider if elevations consistently >130/80. Consider 12.5 MG HCTZ if elevations, 25 MG caused hypotension.  Labs today: CBC, CMP, TSH, Lipid.  Return in 6 months.       Relevant Orders   CBC with Differential/Platelet   Comprehensive metabolic panel   TSH   Mild mitral regurgitation    Followed by cardiology, continue this collaboration.      Mild tricuspid regurgitation    Followed by cardiology, continue this collaboration.        Other   Low hemoglobin    Ongoing, mild since June 2021 -- normal iron level + negative FOBT past checks.  Is very active at baseline, suspect this may be reason for mild low levels.  Continue supplements at home and monitor.  If any significant decline will get into GI or hematology for further evaluation.  CBC, iron,  ferritin today.      Relevant Orders   CBC with Differential/Platelet   Iron Binding Cap (TIBC)([facility])   Ferritin   Vitamin B12   Mixed hyperlipidemia    Chronic, stable.  Continue current medication regimen and adjust as needed. Lipid panel and CMP today.        Relevant Orders   Comprehensive metabolic panel   Lipid Panel w/o Chol/HDL Ratio   Other Visit Diagnoses     Benign prostatic hyperplasia without lower urinary tract symptoms       PSA on labs today.   Relevant Orders   PSA   Pneumococcal vaccination given       PCV20 in office today and educated on this.   Relevant Orders   Pneumococcal conjugate vaccine 20-valent (Prevnar 20) (Completed)   Need for Td vaccine       Td in office today and educated on this.   Relevant Orders   Td vaccine greater than or equal to 7yo preservative free IM (Completed)   Encounter for annual physical exam       Annual physical today with labs and health maintenance reviewed, discussed with patient.       Discussed aspirin prophylaxis for myocardial infarction prevention and decision was made to continue ASA  LABORATORY TESTING:  Health maintenance labs ordered  today as discussed above.   The natural history of prostate cancer and ongoing controversy regarding screening and potential treatment outcomes of prostate cancer has been discussed with the patient. The meaning of a false positive PSA and a false negative PSA has been discussed. He indicates understanding of the limitations of this screening test and wishes to proceed with screening PSA testing.   IMMUNIZATIONS:   - Tdap: Tetanus vaccination status reviewed: last tetanus booster within 10 years. - Influenza: Up to date - Pneumovax: Not applicable - Prevnar: Up To Date - Zostavax vaccine: Up To Date  SCREENING: - Colonoscopy: Up to date  Discussed with patient purpose of the colonoscopy is to detect colon cancer at curable precancerous or early stages   - AAA Screening: Not applicable  -Hearing Test: Not applicable  -Spirometry: Not applicable   PATIENT COUNSELING:    Sexuality: Discussed sexually transmitted diseases, partner selection, use of condoms, avoidance of unintended pregnancy  and contraceptive alternatives.   Advised to avoid cigarette smoking.  I discussed with the patient that most people either abstain from alcohol or drink within safe limits (<=14/week and <=4 drinks/occasion for males, <=7/weeks and <= 3 drinks/occasion for females) and that the risk for alcohol disorders and other health effects rises proportionally with the number of drinks per week and how often a drinker exceeds daily limits.  Discussed cessation/primary prevention of drug use and availability of treatment for abuse.   Diet: Encouraged to adjust caloric intake to maintain  or achieve ideal body weight, to reduce intake of dietary saturated fat and total fat, to limit sodium intake by avoiding high sodium foods and not adding table salt, and to maintain adequate dietary potassium and calcium preferably from fresh fruits, vegetables, and low-fat dairy products.    Stressed the importance of regular  exercise  Injury prevention: Discussed safety belts, safety helmets, smoke detector, smoking near bedding or upholstery.   Dental health: Discussed importance of regular tooth brushing, flossing, and dental visits.   Follow up plan: NEXT PREVENTATIVE PHYSICAL DUE IN 1 YEAR. Return in about 6 months (around 05/19/2023) for HTN/HLD.

## 2022-11-16 NOTE — Assessment & Plan Note (Signed)
Followed by cardiology, continue this collaboration. ?

## 2022-11-16 NOTE — Assessment & Plan Note (Signed)
Ongoing, mild since June 2021 -- normal iron level + negative FOBT past checks.  Is very active at baseline, suspect this may be reason for mild low levels.  Continue supplements at home and monitor.  If any significant decline will get into GI or hematology for further evaluation.  CBC, iron, ferritin today. 

## 2022-11-16 NOTE — Assessment & Plan Note (Signed)
Chronic, stable.  Continue current medication regimen and adjust as needed.  Lipid panel and CMP today. 

## 2022-11-16 NOTE — Assessment & Plan Note (Signed)
Chronic, stable.  BP at goal in office and on average at home.  Recommend he monitor BP at least a few mornings a week at home and document.  DASH diet at home.  Continue Lisinopril 40 MG daily, alert provider if elevations consistently >130/80. Consider 12.5 MG HCTZ if elevations, 25 MG caused hypotension.  Labs today: CBC, CMP, TSH, Lipid.  Return in 6 months.

## 2022-11-17 ENCOUNTER — Other Ambulatory Visit: Payer: Self-pay | Admitting: Nurse Practitioner

## 2022-11-17 DIAGNOSIS — R972 Elevated prostate specific antigen [PSA]: Secondary | ICD-10-CM

## 2022-11-17 LAB — CBC WITH DIFFERENTIAL/PLATELET
Basophils Absolute: 0 10*3/uL (ref 0.0–0.2)
Basos: 1 %
EOS (ABSOLUTE): 0 10*3/uL (ref 0.0–0.4)
Eos: 1 %
Hematocrit: 37.6 % (ref 37.5–51.0)
Hemoglobin: 12.7 g/dL — ABNORMAL LOW (ref 13.0–17.7)
Immature Grans (Abs): 0 10*3/uL (ref 0.0–0.1)
Immature Granulocytes: 0 %
Lymphocytes Absolute: 0.9 10*3/uL (ref 0.7–3.1)
Lymphs: 20 %
MCH: 33 pg (ref 26.6–33.0)
MCHC: 33.8 g/dL (ref 31.5–35.7)
MCV: 98 fL — ABNORMAL HIGH (ref 79–97)
Monocytes Absolute: 0.4 10*3/uL (ref 0.1–0.9)
Monocytes: 9 %
Neutrophils Absolute: 3.3 10*3/uL (ref 1.4–7.0)
Neutrophils: 69 %
Platelets: 184 10*3/uL (ref 150–450)
RBC: 3.85 x10E6/uL — ABNORMAL LOW (ref 4.14–5.80)
RDW: 12.7 % (ref 11.6–15.4)
WBC: 4.7 10*3/uL (ref 3.4–10.8)

## 2022-11-17 LAB — IRON AND TIBC
Iron Saturation: 39 % (ref 15–55)
Iron: 121 ug/dL (ref 38–169)
Total Iron Binding Capacity: 310 ug/dL (ref 250–450)
UIBC: 189 ug/dL (ref 111–343)

## 2022-11-17 LAB — COMPREHENSIVE METABOLIC PANEL
ALT: 12 IU/L (ref 0–44)
AST: 41 IU/L — ABNORMAL HIGH (ref 0–40)
Albumin: 4.5 g/dL (ref 3.9–4.9)
Alkaline Phosphatase: 58 IU/L (ref 44–121)
BUN/Creatinine Ratio: 14 (ref 10–24)
BUN: 18 mg/dL (ref 8–27)
Bilirubin Total: 0.6 mg/dL (ref 0.0–1.2)
CO2: 24 mmol/L (ref 20–29)
Calcium: 9.6 mg/dL (ref 8.6–10.2)
Chloride: 101 mmol/L (ref 96–106)
Creatinine, Ser: 1.27 mg/dL (ref 0.76–1.27)
Globulin, Total: 1.8 g/dL (ref 1.5–4.5)
Glucose: 101 mg/dL — ABNORMAL HIGH (ref 70–99)
Potassium: 4.1 mmol/L (ref 3.5–5.2)
Sodium: 138 mmol/L (ref 134–144)
Total Protein: 6.3 g/dL (ref 6.0–8.5)
eGFR: 62 mL/min/{1.73_m2} (ref >59)

## 2022-11-17 LAB — FERRITIN: Ferritin: 378 ng/mL (ref 30–400)

## 2022-11-17 LAB — PSA: Prostate Specific Ag, Serum: 5.8 ng/mL — ABNORMAL HIGH (ref 0.0–4.0)

## 2022-11-17 LAB — LIPID PANEL W/O CHOL/HDL RATIO
Cholesterol, Total: 162 mg/dL (ref 100–199)
HDL: 59 mg/dL (ref >39)
LDL Chol Calc (NIH): 80 mg/dL (ref 0–99)
Triglycerides: 129 mg/dL (ref 0–149)
VLDL Cholesterol Cal: 23 mg/dL (ref 5–40)

## 2022-11-17 LAB — TSH: TSH: 1.68 u[IU]/mL (ref 0.450–4.500)

## 2022-11-17 LAB — VITAMIN B12: Vitamin B-12: 1219 pg/mL (ref 232–1245)

## 2022-11-17 NOTE — Progress Notes (Signed)
Called and scheduled patient on 12/15/2022 @ 3:40 pm.

## 2022-11-17 NOTE — Progress Notes (Signed)
Contacted via MyChart -- needs lab only visit in 4 weeks please   Good afternoon Warren Richardson your labs have returned: - Kidney function, creatinine and eGFR, remains stable, as is liver function, AST (very mild elevation) and ALT.  - Glucose very mildly elevated. - CBC continues to show your baseline lower hemoglobin, but improved.  Iron level normal and ferritin now normal.  B12 level normal. - Cholesterol levels look great and thyroid is normal. - My only concern is PSA, prostate lab, this is elevated.  Any recent intercourse? We need to recheck this in 4 weeks and if still elevated get you into urology.  This does not mean you have prostate cancer, it could be enlarged prostate or even recent stimulation (such as recent intercourse).  Any questions?  Your form is ready. Keep being amazing!!  Thank you for allowing me to participate in your care.  I appreciate you. Kindest regards, Abbie Berling

## 2022-12-15 ENCOUNTER — Ambulatory Visit: Payer: Managed Care, Other (non HMO) | Admitting: Nurse Practitioner

## 2022-12-15 ENCOUNTER — Other Ambulatory Visit: Payer: Managed Care, Other (non HMO)

## 2022-12-15 DIAGNOSIS — R972 Elevated prostate specific antigen [PSA]: Secondary | ICD-10-CM

## 2022-12-16 ENCOUNTER — Other Ambulatory Visit: Payer: Self-pay | Admitting: Nurse Practitioner

## 2022-12-16 DIAGNOSIS — C61 Malignant neoplasm of prostate: Secondary | ICD-10-CM | POA: Insufficient documentation

## 2022-12-16 DIAGNOSIS — R972 Elevated prostate specific antigen [PSA]: Secondary | ICD-10-CM

## 2022-12-16 NOTE — Progress Notes (Signed)
Contacted via MyChart   Good morning Warren Richardson, your prostate blood work has returned and PSA remains elevated in 5 range, a slight trend down.  I would like for you to see urology and will place this referral through.  This does not mean you have prostate cancer, it may be your prostate is a bit enlarged.  However, would be good for you to see them for further assessment.  Any questions? Keep being wonderful!!  Thank you for allowing me to participate in your care.  I appreciate you. Kindest regards, Earsie Humm

## 2022-12-18 ENCOUNTER — Encounter: Payer: Self-pay | Admitting: Nurse Practitioner

## 2023-01-24 ENCOUNTER — Encounter: Payer: Self-pay | Admitting: Urology

## 2023-01-24 ENCOUNTER — Ambulatory Visit: Payer: Managed Care, Other (non HMO) | Admitting: Urology

## 2023-01-24 VITALS — BP 163/95 | HR 80 | Ht 66.5 in | Wt 150.0 lb

## 2023-01-24 DIAGNOSIS — R350 Frequency of micturition: Secondary | ICD-10-CM | POA: Diagnosis not present

## 2023-01-24 DIAGNOSIS — N401 Enlarged prostate with lower urinary tract symptoms: Secondary | ICD-10-CM | POA: Diagnosis not present

## 2023-01-24 DIAGNOSIS — R972 Elevated prostate specific antigen [PSA]: Secondary | ICD-10-CM

## 2023-01-24 NOTE — Patient Instructions (Signed)
Prostate Biopsy Instructions  Stop all aspirin or blood thinners (aspirin, plavix, coumadin, warfarin, motrin, ibuprofen, advil, aleve, naproxen, naprosyn) for 7 days prior to the procedure.  If you have any questions about stopping these medications, please contact your primary care physician or cardiologist.  Having a light meal prior to the procedure is recommended.  If you are diabetic or have low blood sugar please bring a small snack or glucose tablet.  A Fleets enema is needed to be purchased over the counter at a local pharmacy and used 2 hours before you scheduled appointment.  This can be purchased over the counter at any pharmacy.  Antibiotics will be administered in the clinic at the time of the procedure unless otherwise specified.    Please bring someone with you to the procedure to drive you home.  A follow up appointment has been scheduled for you to receive the results of the biopsy.  If you have any questions or concerns, please feel free to call the office at [phone] or send a Mychart message.    Thank you, Staff at [facility]  Transrectal Prostate Biopsy Patient Education and Post Procedure Instructions       -Definition A prostate biopsy is the removal of a small amount of tissue from the prostate gland. The tissue is examined to determine whether there is cancer.   -Reasons for Procedure A prostate biopsy is usually done after an abnormal finding by: Digital rectal exam Prostate specific antigen (PSA) blood test A prostate biopsy is the only way to find out if cancer cells are present.   -Possible Complications Problems from the procedure are rare, but all procedures have some risk including: Infection Bruising or lengthy bleeding from the rectum, or in urine or semen Difficulty urinating Reactions to anesthesia Factors that may increase the risk of complications include: Smoking History of bleeding disorders or easy bruising Use of any  medications, over-the-counter medications, or herbal supplements Sensitivity or allergy to latex, medications, or anesthesia.   -Prior to Procedure Talk to your doctor about your medications. Blood thinning medications including aspirin should be stopped 1 week prior to procedure. If prescribed by your cardiologist we may need approval before stopping medications. Use a Fleets enema 2 hours before the procedure. Can be purchased at your pharmacy. Antibiotics will be administered in the clinic prior to procedure.  Please make sure you eat a light meal prior to coming in for your appointment. This can help prevent lightheadedness during the procedure and upset stomach from antibiotics. Please bring someone with you to the procedure to drive you home.   -Anesthesia Transrectal biopsy: Local anesthesia--Just the area that is being operated on is numbed using an injectable anesthetic.   -Description of the Procedure Transrectal biopsy--Your doctor will insert a small ultrasound device into the rectum. This device will produce sound waves to create an image of the prostate. These images will help guide placement of the needle. Your doctor will then insert the needle through the wall of the rectum and into the prostate gland. The procedure should take approximately 15-30 minutes.   -Will It Hurt? You may have discomfort and soreness at the biopsy site. Pain and discomfort after the procedure can be managed with medications.   -Postoperative Care When you return home after the procedure, do the following to help ensure a smooth recovery: Stay hydrated. Drink plenty of fluids for the next few days. Avoid difficult physical activity the day and evening of the procedure.  Keep in mind that you may see blood in your urine, stool, or semen for several days. Resume any medications that were stopped when you are advised to do so.   After the sample is taken, it will be sent to a pathologist for  examination under a microscope. This doctor will analyze the sample for cancer. You will be scheduled for an appointment to discuss results. If cancer is present, your doctor will work with you to develop a treatment plan.     -Call Your Doctor or Seek Immediate Medical Attention It is important to monitor your recovery. Alert your doctor to any problems. If any of the following occur, call your doctor or go to the emergency room: Fever 100.5 or greater within 1 week post procedure go directly to ER Call the office for: Blood in the urine more than 1 week or in semen for more than 6 weeks post-biopsy Pain that you cannot control with the medications you have been given Pain, burning, urgency, or frequency of urination Cough, shortness of breath, or chest pain- if severe go to ER Heavy rectal bleeding or bleeding that lasts more than 1 week after the biopsy If you have any questions or concerns please contact our office at [facility]   [facility] [address] [place], [place] [address] [phone]

## 2023-01-24 NOTE — Progress Notes (Signed)
Warren Richardson,acting as a scribe for Vanna Scotland, MD.,have documented all relevant documentation on the behalf of Vanna Scotland, MD,as directed by  Vanna Scotland, MD while in the presence of Vanna Scotland, MD.  01/24/2023 9:20 AM   Warren Richardson [birth year] [id number]  Referring provider: Marjie Skiff, NP [address] [place],  [place] [address]  Chief Complaint  Patient presents with   New Patient (Initial Visit)   Elevated PSA    HPI: 67 year-old male who is referred for further evaluation of elevated PSA.  His most recent PSA on 12/15/2022 was 5.4. On 11/16/2022, his PSA was 5.8. He did also have a free PSA checked, which was 25%.   He is formerly a patient of Dr. Evelene Croon. We have requested and received those records, which I am virtually reviewing today. He was last seen in 2021 for a lesion on his dorsal penile shaft. 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 He has never been seen and evaluated for any prostate related issues.   Today, he reports urinary frequency, urgency, and nocturia, getting up at least two times per night. He takes saw palmetto for urinary issues but reports variable urinary stream strength. He reports a family history of prostate cancer with his father being diagnosed. He reports a personal history of a prostate infection and Peyronie's disease when he was in his 30s.    Prostate Specific Ag, Serum PSA, Free PSA, Free Pct  Latest Ref Rng 0.0 - 4.0 ng/mL N/A ng/mL %  11/05/2018 3.1     11/07/2019 3.6     11/12/2020 3.7     11/14/2021 3.2     11/16/2022 5.8 (H)     12/15/2022 5.4 (H)  1.36  25.2      PMH: Past Medical History:  Diagnosis Date   Hyperlipidemia    Hypertension      Home Medications:  Allergies as of 01/24/2023       Reactions   Atorvastatin Other (See Comments)   Increased liver enzymes        Medication List        Accurate as of January 24, 2023  9:20 AM. If you have any questions,  ask your nurse or doctor.          aspirin 81 MG chewable tablet Chew 81 mg by mouth daily.   BIOTIN PO Take 250 mg by mouth daily.   BLACK ELDERBERRY PO Take by mouth.   Fish Oil 1000 MG Caps Take 1,000 mg by mouth 2 (two) times daily.   GINGER ROOT PO 1 Dose once daily   glucosamine-chondroitin 500-400 MG tablet Take 1 tablet by mouth 2 (two) times daily.   lisinopril 40 MG tablet Commonly known as: ZESTRIL Take 1 tablet (40 mg total) by mouth daily.   Multi-Vitamins Tabs Take 1 tablet by mouth daily.   rosuvastatin 40 MG tablet Commonly known as: CRESTOR TAKE 1 TABLET BY MOUTH EVERY DAY   SAW PALMETTO PO Take by mouth.        Allergies:  Allergies  Allergen Reactions   Atorvastatin Other (See Comments)    Increased liver enzymes    Family History: Family History  Problem Relation Age of Onset   Arthritis Mother    Stroke Mother    Dementia Mother    Cancer Father        prostate   Diabetes Father    Hyperlipidemia Father  Hypertension Father    Cancer Sister        breast    Social History:  reports that he has never smoked. He has never been exposed to tobacco smoke. He has never used smokeless tobacco. He reports current alcohol use of about 1.0 - 2.0 standard drink of alcohol per week. He reports that he does not use drugs.   Physical Exam: BP (!) 163/95   Pulse 80   Ht 5' 6.5" (1.689 m)   Wt 150 lb (68 kg)   BMI 23.85 kg/m   Constitutional:  Alert and oriented, No acute distress. HEENT: [place] AT, moist mucus membranes.  Trachea midline, no masses. Rectal:  Large prostate with firm lateral ridges bilaterally, subtle nodularity. Normal sphincter tone. External hemorrhoids noted.  Neurologic: Grossly intact, no focal deficits, moving all 4 extremities. Psychiatric: Normal mood and affect.  Assessment & Plan:    1. Elevated PSA - Recent PSA 5.4 on 12/15/2022 - Recommend prostate biopsy based on family history and elevated/rising  PSA -Alternatively could consider following PSA closely with or without MRI; sensitivity and specificity reviewed in detail -Decision for biopsy today - We discussed prostate biopsy in detail including the procedure itself, the risks of blood in the urine, stool, and ejaculate, serious infection, and discomfort. He is willing to proceed with this as discussed. - Advised to hold aspirin beginning 1 week prior to procedure and he can resume afterwards  2. BPH  - Prostatomegaly noted on exam - Depending on biopsy results, discuss potential interventions for urinary symptoms   Return for prostate biopsy.  I have reviewed the above documentation for accuracy and completeness, and I agree with the above.   Vanna Scotland, MD   [facility] [address], [address] [place], [place] [address] [phone]

## 2023-01-25 ENCOUNTER — Telehealth: Payer: Self-pay

## 2023-01-25 NOTE — Telephone Encounter (Signed)
Cardiac clearance obtained from patient's cardiologist Minda Ditto at [facility], ok for patient to stop Aspirin 81 mg for 7 days prior to the biopsy.  I left detailed message for patient advising him of this information. Note scanned to media section.

## 2023-02-15 ENCOUNTER — Ambulatory Visit: Payer: Self-pay

## 2023-02-15 NOTE — Telephone Encounter (Signed)
     Chief Complaint: Pt. Is at work and states "I can't remember how to get in my work computer and accounts. I can't even remember how to access by own banking accounts." States he has co-workers with him, some he can remember , some he can't. Symptoms: Above Frequency: This morning Pertinent Negatives: Patient denies any physical symptoms. No headache, chest pain. Disposition: [x] ED /[] Urgent Care (no appt availability in office) / [] Appointment(In office/virtual)/ []  [facility]/ [] Home Care/ [] Refused Recommended Disposition /[] [facility]/ []  Follow-up with PCP Additional Notes: Pt. Agrees with ED , will co-worker take him.  Reason for Disposition  Patient sounds very sick or weak to the triager  Answer Assessment - Initial Assessment Questions 1. SYMPTOM: "What is the main symptom you are concerned about?" (e.g., weakness, numbness)     Cannot remember 2. ONSET: "When did this start?" (minutes, hours, days; while sleeping)     This morning 3. LAST NORMAL: "When was the last time you (the patient) were normal (no symptoms)?"     This morning 4. PATTERN "Does this come and go, or has it been constant since it started?"  "Is it present now?"     Constant 5. CARDIAC SYMPTOMS: "Have you had any of the following symptoms: chest pain, difficulty breathing, palpitations?"     No 6. NEUROLOGIC SYMPTOMS: "Have you had any of the following symptoms: headache, dizziness, vision loss, double vision, changes in speech, unsteady on your feet?"     None 7. OTHER SYMPTOMS: "Do you have any other symptoms?"     No 8. PREGNANCY: "Is there any chance you are pregnant?" "When was your last menstrual period?"     N/a  Answer Assessment - Initial Assessment Questions 1. LEVEL OF CONSCIOUSNESS: "How is he (she, the patient) acting right now?" (e.g., alert-oriented, confused, lethargic, stuporous, comatose)     Alert, cannot remember how to enter work accounts, personal banbking  accounts 2. ONSET: "When did the confusion start?"  (minutes, hours, days)     This morning 3. PATTERN "Does this come and go, or has it been constant since it started?"  "Is it present now?"     Constant - now 4. ALCOHOL or DRUGS: "Has he been drinking alcohol or taking any drugs?"      No 5. NARCOTIC MEDICINES: "Has he been receiving any narcotic medications?" (e.g., morphine, Vicodin)     No 6. CAUSE: "What do you think is causing the confusion?"      Unsure 7. OTHER SYMPTOMS: "Are there any other symptoms?" (e.g., difficulty breathing, headache, fever, weakness)     No  Protocols used: Neurologic Deficit-A-AH, Confusion - Delirium-A-AH

## 2023-02-21 ENCOUNTER — Ambulatory Visit: Payer: Managed Care, Other (non HMO) | Admitting: Urology

## 2023-02-21 VITALS — BP 162/91 | HR 66

## 2023-02-21 DIAGNOSIS — C61 Malignant neoplasm of prostate: Secondary | ICD-10-CM

## 2023-02-21 DIAGNOSIS — R972 Elevated prostate specific antigen [PSA]: Secondary | ICD-10-CM | POA: Diagnosis not present

## 2023-02-21 DIAGNOSIS — Z2989 Encounter for other specified prophylactic measures: Secondary | ICD-10-CM | POA: Diagnosis not present

## 2023-02-21 MED ORDER — LEVOFLOXACIN 500 MG PO TABS
500.0000 mg | ORAL_TABLET | Freq: Once | ORAL | Status: AC
Start: 2023-02-21 — End: 2023-02-21
  Administered 2023-02-21: 500 mg via ORAL

## 2023-02-21 MED ORDER — GENTAMICIN SULFATE 40 MG/ML IJ SOLN
80.0000 mg | Freq: Once | INTRAMUSCULAR | Status: AC
Start: 2023-02-21 — End: 2023-02-21
  Administered 2023-02-21: 80 mg via INTRAMUSCULAR

## 2023-02-21 NOTE — Progress Notes (Signed)
Warren Richardson presents for a procedure visit. BP today is __162/91_________. He is complaint with BP medication. Greater than 140/90. Provider  notified. Pt advised to__f/u with PCP____________. Pt voiced understanding.

## 2023-02-21 NOTE — Progress Notes (Signed)
   02/21/23  CC:  Chief Complaint  Patient presents with   Prostate Biopsy    HPI: 67 year old male with elevated/rising PSA presents today for prostate biopsy.  Notably he mentions today that he was seen in the emergency room just a few days ago at [facility] with some neurological issues possibly TIA.  Blood pressure (!) 175/102, pulse 77. NED. A&Ox3.   No respiratory distress   Abd soft, NT, ND Normal sphincter tone  Prostate Biopsy Procedure   Informed consent was obtained after discussing risks/benefits of the procedure.  A time out was performed to ensure correct patient identity.  Pre-Procedure: - Last PSA Level:  Lab Results  Component Value Date   PSA from [facility] 07/06/2014   - Gentamicin given prophylactically - Levaquin 500 mg administered PO -Transrectal Ultrasound performed revealing a 55.18 gm prostate -No significant hypoechoic or median lobe noted.  He does have asymmetry with the left side of the prostate greater than the right.  Procedure: - Prostate block performed using 10 cc 1% lidocaine and biopsies taken from sextant areas, a total of 12 under ultrasound guidance.  Post-Procedure: - Patient tolerated the procedure well - He was counseled to seek immediate medical attention if experiences any severe pain, significant bleeding, or fevers - Return in one week to discuss biopsy results -Okay to resume aspirin ASAP in light of recent neurological event   Vanna Scotland, MD

## 2023-02-28 ENCOUNTER — Ambulatory Visit (INDEPENDENT_AMBULATORY_CARE_PROVIDER_SITE_OTHER): Payer: Managed Care, Other (non HMO) | Admitting: Urology

## 2023-02-28 VITALS — BP 167/81 | HR 93 | Ht 66.5 in | Wt 152.0 lb

## 2023-02-28 DIAGNOSIS — C61 Malignant neoplasm of prostate: Secondary | ICD-10-CM

## 2023-02-28 DIAGNOSIS — N401 Enlarged prostate with lower urinary tract symptoms: Secondary | ICD-10-CM | POA: Diagnosis not present

## 2023-02-28 DIAGNOSIS — R350 Frequency of micturition: Secondary | ICD-10-CM | POA: Diagnosis not present

## 2023-02-28 MED ORDER — TAMSULOSIN HCL 0.4 MG PO CAPS
0.4000 mg | ORAL_CAPSULE | Freq: Every day | ORAL | 11 refills | Status: DC
Start: 2023-02-28 — End: 2023-05-21

## 2023-02-28 NOTE — Patient Instructions (Signed)
Prostate MRI Prep:  1- No ejaculation 48 hours prior to exam  2- No caffeine or carbonated beverages on day of the exam  3- Eat light diet evening prior and day of exam  4- Avoid eating 4 hours prior to exam  5- Fleets enema needs to be done 4 hours prior to exam -See below. Can be purchased at the drug store.

## 2023-03-01 NOTE — Progress Notes (Signed)
I, Warren Richardson, acting as a scribe for Warren Scotland, MD., have documented all relevant documentation on the behalf of Warren Scotland, MD, as directed by Warren Scotland, MD while in the presence of Warren Scotland, MD.  03/01/2023 4:34 PM   Warren Richardson [birth year] [id number]  Referring provider: Marjie Skiff, NP [address] [place],  [place] [address]  Chief Complaint  Patient presents with   Results    HPI: 67 year old male who presents today for discussion of prostate biopsy results.   Notably, he was seen and evaluated with elevated PSA up to 5.8, which has been slowly rising for several years.   He does have some baseline urinary symptoms including urgency frequency and uses saw palmetto.  He underwent a complicated prostate biopsy on 04/23/2023 which showed a volume of 55.2 grams with some asimmetry. The left side of the prostate was greater than the right.   Prostate biopsy revealed 2 cores of Gleason 3+3 up to 18% involving the left lateral base. He also had a suspicious focus of atypical glands at the left lateral apex, but not diagnostic. Along with other areas of pin, he also had focal inflammation.  His enlarged prostate could be contributing to his urinary symptoms.   IPSS 15/35      SHIM     Row Name 02/28/23 1601         SHIM: Over the last 6 months:   How do you rate your confidence that you could get and keep an erection? High     When you had erections with sexual stimulation, how often were your erections hard enough for penetration (entering your partner)? Most Times (much more than half the time)     During sexual intercourse, how often were you able to maintain your erection after you had penetrated (entered) your partner? Most Times (much more than half the time)     During sexual intercourse, how difficult was it to maintain your erection to completion of intercourse? Not Difficult     When you attempted sexual intercourse, how often  was it satisfactory for you? Almost Always or Always       SHIM Total Score   SHIM 22              PMH: Past Medical History:  Diagnosis Date   Hyperlipidemia    Hypertension     Home Medications:  Allergies as of 02/28/2023       Reactions   Atorvastatin Other (See Comments)   Increased liver enzymes        Medication List        Accurate as of February 28, 2023 11:59 PM. If you have any questions, ask your nurse or doctor.          aspirin 81 MG chewable tablet Chew 81 mg by mouth daily.   BIOTIN PO Take 250 mg by mouth daily.   BLACK ELDERBERRY PO Take by mouth.   Fish Oil 1000 MG Caps Take 1,000 mg by mouth 2 (two) times daily.   GINGER ROOT PO 1 Dose once daily   glucosamine-chondroitin 500-400 MG tablet Take 1 tablet by mouth 2 (two) times daily.   lisinopril 40 MG tablet Commonly known as: ZESTRIL Take 1 tablet (40 mg total) by mouth daily.   Multi-Vitamins Tabs Take 1 tablet by mouth daily.   rosuvastatin 40 MG tablet Commonly known as: CRESTOR TAKE 1 TABLET BY MOUTH EVERY DAY   SAW PALMETTO  PO Take by mouth.   tamsulosin 0.4 MG Caps capsule Commonly known as: FLOMAX Take 1 capsule (0.4 mg total) by mouth daily. Started by: Warren Richardson        Allergies:  Allergies  Allergen Reactions   Atorvastatin Other (See Comments)    Increased liver enzymes    Family History: Family History  Problem Relation Age of Onset   Arthritis Mother    Stroke Mother    Dementia Mother    Cancer Father        prostate   Diabetes Father    Hyperlipidemia Father    Hypertension Father    Cancer Sister        breast    Social History:  reports that he has never smoked. He has never been exposed to tobacco smoke. He has never used smokeless tobacco. He reports current alcohol use of about 1.0 - 2.0 standard drink of alcohol per week. He reports that he does not use drugs.   Physical Exam: BP (!) 167/81   Pulse 93   Ht 5' 6.5"  (1.689 m)   Wt 152 lb (68.9 kg)   BMI 24.17 kg/m   Constitutional:  Alert and oriented, No acute distress. HEENT: [place] AT, moist mucus membranes.  Trachea midline, no masses. Neurologic: Grossly intact, no focal deficits, moving all 4 extremities. Psychiatric: Normal mood and affect.   Assessment & Plan:    1. Prostate cancer Newly diagnosed low risk, low-volume (very low risk prostate cance)  The patient was counseled about the natural history of prostate cancer and the standard treatment options that are available for prostate cancer. It was explained to him how his age and life expectancy, clinical stage, Gleason score, and PSA affect his prognosis, the decision to proceed with additional staging studies, as well as how that information influences recommended treatment strategies. We discussed the roles for active surveillance, radiation therapy, surgical therapy, androgen deprivation, as well as ablative therapy options for the treatment of prostate cancer as appropriate to his individual cancer situation. We discussed the risks and benefits of these options with regard to their impact on cancer control and also in terms of potential adverse events, complications, and impact on quality of life particularly related to urinary, bowel, and sexual function. The patient was encouraged to ask questions throughout the discussion today and all questions were answered to his stated satisfaction. In addition, the patient was provided with and/or directed to appropriate resources and literature for further education about prostate cancer treatment options.  Given that this is low risk, recommend conservative management of active surveillance. We discussed the management methods of serial PSAs, rectal exams mris and then biopsies.  Plan for PSA in 6 months along with MRI.  He is agreeable this plan.  MRI will serve as baseline.   2. BPH  We discussed Flomax to see if it would improve urinary symptoms.  We also discussed procedures and surgeries on the prostate to improve urinary symptoms. He expressed interest in Flomax.   The side effects of Flomax were discussed including decrease in blood pressure, retrograde ejaculation, and occasionaly fatigue. He was instracted to take the medication at night.   Flomax was prescribed and he was instructed to contact regard efficacy.   He will follow up in 6 months with PSA, blood work, and MRI.   Return in about 6 months (around 08/29/2023).  I have reviewed the above documentation for accuracy and completeness, and I agree with the above.  Warren Scotland, MD  I spent 42 total minutes on the day of the encounter including pre-visit review of the medical record, face-to-face time with the patient, and post visit ordering of labs/imaging/tests.   Warren Richardson. MD  [facility] [address] [place], [place] [address] [phone]

## 2023-03-03 NOTE — Patient Instructions (Signed)
Be Involved in Caring For Your Health:  Taking Medications When medications are taken as directed, they can greatly improve your health. But if they are not taken as prescribed, they may not work. In some cases, not taking them correctly can be harmful. To help ensure your treatment remains effective and safe, understand your medications and how to take them. Bring your medications to each visit for review by your provider.  Your lab results, notes, and after visit summary will be available on My Chart. We strongly encourage you to use this feature. If lab results are abnormal the clinic will contact you with the appropriate steps. If the clinic does not contact you assume the results are satisfactory. You can always view your results on My Chart. If you have questions regarding your health or results, please contact the clinic during office hours. You can also ask questions on My Chart.  We at [facility] are grateful that you chose [place] to provide your care. We strive to provide evidence-based and compassionate care and are always looking for feedback. If you get a survey from the clinic please complete this so we can hear your opinions.  Transient Ischemic Attack A transient ischemic attack (TIA) causes the same symptoms as a stroke, but the symptoms go away quickly. A TIA happens when blood flow to the brain is blocked. Having a TIA means you may be at risk for a stroke. A TIA is a medical emergency. What are the causes? A TIA is caused by a blocked artery in the head or neck. This means the brain does not get the blood supply it needs. A blockage can be caused by: Fatty buildup in an artery in the head or neck. A blood clot. A tear in an artery. Irritation and swelling (inflammation) of an artery. Sometimes the cause is not known. What increases the risk? Certain things may make you more likely to have a TIA. Some of these are things that you can change, such as: Using products  that have nicotine or tobacco. Not being active. Drinking too much alcohol. Using recreational drugs. Health conditions that may increase your risk include: High blood pressure. High cholesterol. Diabetes. Heart disease. A heartbeat that is not regular (atrial fibrillation). Sickle cell disease. Problems with blood clotting. Other risk factors include: Being over the age of 10. Being male. Being very overweight. Sleep problems (sleep apnea). Having a family history of stroke. Having had blood clots, stroke, TIA, or heart attack in the past. What are the signs or symptoms? The symptoms of a TIA are like those of a stroke. They can include: Weakness or loss of feeling in your face, arm, or leg. This often happens on one side of your body. Trouble walking. Trouble moving your arms or legs. Trouble talking or understanding what people are saying. Problems with how you see. Feeling dizzy. Feeling confused. Loss of balance or coordination. Feeling like you may vomit (nausea) or vomiting. Having a very bad headache. If you can, note what time you started to have symptoms. Tell your doctor. How is this treated? The goal of treatment is to lower the risk for a stroke. This may include: Changes to diet and lifestyle, such as getting regular exercise and stopping smoking. Taking medicines to: Thin the blood. Lower blood pressure. Lower cholesterol. Treating other health conditions, such as diabetes. If testing shows that an artery in your brain is narrow, your doctor may recommend a procedure to: Take the blockage out of  your artery. Open or widen an artery in your neck (carotid angioplasty and stenting). Follow these instructions at home: Medicines Take over-the-counter and prescription medicines only as told by your doctor. If you were told to take aspirin or another medicine to thin your blood, use it exactly as told by your doctor. Taking too much of the medicine can cause  bleeding. Taking too little of the medicine may not work to treat the problem. Eating and drinking  Eat 5 or more servings of fruits and vegetables each day. Follow instructions from your doctor about your diet. You may need to follow a certain diet to help lower your risk of a stroke. You may need to: Eat a diet that is low in fat and salt. Eat foods with a lot of fiber. Limit carbohydrates and sugar. If you drink alcohol: Limit how much you have to: 0-1 drink a day for women who are not pregnant. 0-2 drinks a day for men. Know how much alcohol is in a drink. In the [place] one drink equals one 12 oz bottle of beer (355 mL), one 5 oz glass of wine (148 mL), or one 1 oz glass of hard liquor (44 mL). General instructions Keep a healthy weight. Try to get at least 30 minutes of exercise on most days. Get treatment if you have sleep problems. Do not smoke or use any products that contain nicotine or tobacco. If you need help quitting, ask your doctor. Do not use drugs. Keep all follow-up visits. Your doctor will want to know if you have any more symptoms and to check blood labs if any medicines were prescribed. Where to find more information American Stroke Association: stroke.org Get help right away if: You have chest pain. You have a heartbeat that is not regular. You have any signs of a stroke. "BE FAST" is an easy way to remember the main warning signs: B - Balance. Dizziness, sudden trouble walking, or loss of balance. E - Eyes. Trouble seeing or a change in how you see. F - Face. Sudden weakness or loss of feeling of the face. The face or eyelid may droop on one side. A - Arms. Weakness or loss of feeling in an arm. This happens all of a sudden and most often on one side of the body. S - Speech. Sudden trouble speaking, slurred speech, or trouble understanding what people say. T - Time. Time to call emergency services. Write down what time symptoms started. You have other signs of  a stroke, such as: A sudden, very bad headache with no known cause. Feeling like you may vomit. Vomiting. A seizure. These symptoms may be an emergency. Get help right away. Call 911. Do not wait to see if the symptoms will go away. Do not drive yourself to the hospital. This information is not intended to replace advice given to you by your health care provider. Make sure you discuss any questions you have with your health care provider. Document Revised: 10/07/2021 Document Reviewed: 10/07/2021 Elsevier Patient Education  2024 [employer].

## 2023-03-05 ENCOUNTER — Ambulatory Visit (INDEPENDENT_AMBULATORY_CARE_PROVIDER_SITE_OTHER): Payer: Managed Care, Other (non HMO) | Admitting: Nurse Practitioner

## 2023-03-05 ENCOUNTER — Encounter: Payer: Self-pay | Admitting: Nurse Practitioner

## 2023-03-05 VITALS — BP 122/64 | HR 89 | Temp 97.5°F | Ht 66.5 in | Wt 151.4 lb

## 2023-03-05 DIAGNOSIS — Z23 Encounter for immunization: Secondary | ICD-10-CM

## 2023-03-05 DIAGNOSIS — Z8673 Personal history of transient ischemic attack (TIA), and cerebral infarction without residual deficits: Secondary | ICD-10-CM | POA: Insufficient documentation

## 2023-03-05 DIAGNOSIS — G459 Transient cerebral ischemic attack, unspecified: Secondary | ICD-10-CM | POA: Diagnosis not present

## 2023-03-05 DIAGNOSIS — E782 Mixed hyperlipidemia: Secondary | ICD-10-CM | POA: Diagnosis not present

## 2023-03-05 DIAGNOSIS — I10 Essential (primary) hypertension: Secondary | ICD-10-CM

## 2023-03-05 NOTE — Assessment & Plan Note (Signed)
Acute on 02/15/23, no further incidents and work up in ER was reassuring.  Monitor closely and ensure LDL is at goal and BP.

## 2023-03-05 NOTE — Assessment & Plan Note (Signed)
Chronic, stable.  Continue current medication regimen and adjust as needed. Will check Lipid panel in January and if LDL >70 will add on Zetia.

## 2023-03-05 NOTE — Progress Notes (Signed)
BP 122/64 (BP Location: Left Arm, Patient Position: Sitting, Cuff Size: Normal)   Pulse 89   Temp (!) 97.5 F (36.4 C) (Oral)   Ht 5' 6.5" (1.689 m)   Wt 151 lb 6.4 oz (68.7 kg)   SpO2 98%   BMI 24.07 kg/m    Subjective:    Patient ID: Warren Richardson, male    DOB: January 16, 1956, 67 y.o.   MRN: [id number]  HPI: Warren Richardson is a 67 y.o. male  Chief Complaint  Patient presents with   Hospitalization Follow-up    No new concerns today    ER FOLLOW UP Was seen in ER on 02/15/23 for acute memory change while at work.  Could not remember his password and felt off.  Had work-up performed and CT noted no acute findings and Carotid doppler noted R and L ICA with less then 50%.  Is currently doing better, with no further episodes.  He had not ate that morning.  Is back to the gym 5 days a week. Time since discharge: 18 days Hospital/facility: [facility] Diagnosis: TIA Procedures/tests: CT and labs Consultants: none New medications: none Discharge instructions:  Follow-up with PCP and cardiology Status: better   HYPERTENSION / HYPERLIPIDEMIA Continues on Rosuvastatin and Lisinopril + ASA. Last saw cardiology on 02/26/23 -- ordered echo and Zio.  Satisfied with current treatment? yes Duration of hypertension: chronic BP monitoring frequency: daily BP range: 105/64 to 157/86 - on average <130/80, 8 that ran above BP medication side effects: no Duration of hyperlipidemia: chronic Cholesterol medication side effects: no Cholesterol supplements: fish oil Medication compliance: good compliance Aspirin: yes Recent stressors: no Recurrent headaches: no Visual changes: no Palpitations: no Dyspnea: no Chest pain: no Lower extremity edema: no Dizzy/lightheaded: no   Relevant past medical, surgical, family and social history reviewed and updated as indicated. Interim medical history since our last visit reviewed. Allergies and medications reviewed and updated.  Review  of Systems  Constitutional:  Negative for activity change, diaphoresis, fatigue and fever.  Respiratory:  Negative for cough, chest tightness, shortness of breath and wheezing.   Cardiovascular:  Negative for chest pain, palpitations and leg swelling.  Gastrointestinal: Negative.   Endocrine: Negative for cold intolerance and heat intolerance.  Neurological: Negative.   Psychiatric/Behavioral: Negative.      Per HPI unless specifically indicated above     Objective:    BP 122/64 (BP Location: Left Arm, Patient Position: Sitting, Cuff Size: Normal)   Pulse 89   Temp (!) 97.5 F (36.4 C) (Oral)   Ht 5' 6.5" (1.689 m)   Wt 151 lb 6.4 oz (68.7 kg)   SpO2 98%   BMI 24.07 kg/m   Wt Readings from Last 3 Encounters:  03/05/23 151 lb 6.4 oz (68.7 kg)  02/28/23 152 lb (68.9 kg)  01/24/23 150 lb (68 kg)    Physical Exam Vitals and nursing note reviewed.  Constitutional:      General: He is awake. He is not in acute distress.    Appearance: He is well-developed and well-groomed. He is not ill-appearing or toxic-appearing.  HENT:     Head: Normocephalic.     Right Ear: Hearing and external ear normal.     Left Ear: Hearing and external ear normal.  Eyes:     General: Lids are normal.     Extraocular Movements: Extraocular movements intact.     Conjunctiva/sclera: Conjunctivae normal.  Neck:     Thyroid: No thyromegaly.  Vascular: No carotid bruit.  Cardiovascular:     Rate and Rhythm: Normal rate and regular rhythm.     Heart sounds: Normal heart sounds.  Pulmonary:     Effort: No accessory muscle usage or respiratory distress.     Breath sounds: Normal breath sounds.  Abdominal:     General: Bowel sounds are normal. There is no distension.     Palpations: Abdomen is soft.     Tenderness: There is no abdominal tenderness.  Musculoskeletal:     Cervical back: Full passive range of motion without pain.     Right lower leg: No edema.     Left lower leg: No edema.   Skin:    General: Skin is warm.     Capillary Refill: Capillary refill takes less than 2 seconds.  Neurological:     Mental Status: He is alert and oriented to person, place, and time.     Deep Tendon Reflexes: Reflexes are normal and symmetric.     Reflex Scores:      Brachioradialis reflexes are 2+ on the right side and 2+ on the left side.      Patellar reflexes are 2+ on the right side and 2+ on the left side. Psychiatric:        Attention and Perception: Attention normal.        Mood and Affect: Mood normal.        Speech: Speech normal.        Behavior: Behavior normal. Behavior is cooperative.        Thought Content: Thought content normal.    Results for orders placed or performed in visit on 12/15/22  PSA, total and free  Result Value Ref Range   Prostate Specific Ag, Serum 5.4 (H) 0.0 - 4.0 ng/mL   PSA, Free 1.36 N/A ng/mL   PSA, Free Pct 25.2 %      Assessment & Plan:   Problem List Items Addressed This Visit       Cardiovascular and Mediastinum   Benign essential hypertension    Chronic, stable.  BP at goal in office on recheck and on average at home.  Recommend he monitor BP at least a few mornings a week at home and document.  DASH diet at home.  Continue Lisinopril 40 MG daily, alert provider if elevations consistently >130/80. Consider 12.5 MG HCTZ if elevations, 25 MG caused hypotension OR Amlodipine.  Labs today: BMP.        Relevant Orders   Basic metabolic panel   TIA (transient ischemic attack) - Primary    Acute on 02/15/23, no further incidents and work up in ER was reassuring.  Monitor closely and ensure LDL is at goal and BP.        Other   Mixed hyperlipidemia    Chronic, stable.  Continue current medication regimen and adjust as needed. Will check Lipid panel in January and if LDL >70 will add on Zetia.        Follow up plan: Return for as scheduled January 13th.

## 2023-03-05 NOTE — Assessment & Plan Note (Signed)
Chronic, stable.  BP at goal in office on recheck and on average at home.  Recommend he monitor BP at least a few mornings a week at home and document.  DASH diet at home.  Continue Lisinopril 40 MG daily, alert provider if elevations consistently >130/80. Consider 12.5 MG HCTZ if elevations, 25 MG caused hypotension OR Amlodipine.  Labs today: BMP.

## 2023-03-06 ENCOUNTER — Encounter: Payer: Self-pay | Admitting: Nurse Practitioner

## 2023-03-06 LAB — BASIC METABOLIC PANEL
BUN/Creatinine Ratio: 15 (ref 10–24)
BUN: 16 mg/dL (ref 8–27)
CO2: 22 mmol/L (ref 20–29)
Calcium: 9.3 mg/dL (ref 8.6–10.2)
Chloride: 98 mmol/L (ref 96–106)
Creatinine, Ser: 1.09 mg/dL (ref 0.76–1.27)
Glucose: 159 mg/dL — ABNORMAL HIGH (ref 70–99)
Potassium: 4.4 mmol/L (ref 3.5–5.2)
Sodium: 138 mmol/L (ref 134–144)
eGFR: 74 mL/min/{1.73_m2} (ref >59)

## 2023-03-06 NOTE — Progress Notes (Signed)
Contacted via MyChart   Good morning Warren Richardson, your labs have return.  Did you eat before visit? Sugar is a bit elevated, but your recent A1c at [facility] showed no diabetes or prediabetes.  Everything else looks great!!  Any questions? Keep being stellar!!  Thank you for allowing me to participate in your care.  I appreciate you. Kindest regards, Jabri Blancett

## 2023-03-22 ENCOUNTER — Encounter: Payer: Self-pay | Admitting: Urology

## 2023-05-19 NOTE — Patient Instructions (Signed)
 Be Involved in Caring For Your Health:  Taking Medications When medications are taken as directed, they can greatly improve your health. But if they are not taken as prescribed, they may not work. In some cases, not taking them correctly can be harmful. To help ensure your treatment remains effective and safe, understand your medications and how to take them. Bring your medications to each visit for review by your provider.  Your lab results, notes, and after visit summary will be available on My Chart. We strongly encourage you to use this feature. If lab results are abnormal the clinic will contact you with the appropriate steps. If the clinic does not contact you assume the results are satisfactory. You can always view your results on My Chart. If you have questions regarding your health or results, please contact the clinic during office hours. You can also ask questions on My Chart.  We at [facility] are grateful that you chose [place] to provide your care. We strive to provide evidence-based and compassionate care and are always looking for feedback. If you get a survey from the clinic please complete this so we can hear your opinions.  Heart-Healthy Eating Plan Many factors influence your heart health, including eating and exercise habits. Heart health is also called coronary health. Coronary risk increases with abnormal blood fat (lipid) levels. A heart-healthy eating plan includes limiting unhealthy fats, increasing healthy fats, limiting salt (sodium) intake, and making other diet and lifestyle changes. What is my plan? Your health care provider may recommend that: You limit your fat intake to _________% or less of your total calories each day. You limit your saturated fat intake to _________% or less of your total calories each day. You limit the amount of cholesterol in your diet to less than _________ mg per day. You limit the amount of sodium in your diet to less than _________  mg per day. What are tips for following this plan? Cooking Cook foods using methods other than frying. Baking, boiling, grilling, and broiling are all good options. Other ways to reduce fat include: Removing the skin from poultry. Removing all visible fats from meats. Steaming vegetables in water or broth. Meal planning  At meals, imagine dividing your plate into fourths: Fill one-half of your plate with vegetables and green salads. Fill one-fourth of your plate with whole grains. Fill one-fourth of your plate with lean protein foods. Eat 2-4 cups of vegetables per day. One cup of vegetables equals 1 cup (91 g) broccoli or cauliflower florets, 2 medium carrots, 1 large bell pepper, 1 large sweet potato, 1 large tomato, 1 medium white potato, 2 cups (150 g) raw leafy greens. Eat 1-2 cups of fruit per day. One cup of fruit equals 1 small apple, 1 large banana, 1 cup (237 g) mixed fruit, 1 large orange,  cup (82 g) dried fruit, 1 cup (240 mL) 100% fruit juice. Eat more foods that contain soluble fiber. Examples include apples, broccoli, carrots, beans, peas, and barley. Aim to get 25-30 g of fiber per day. Increase your consumption of legumes, nuts, and seeds to 4-5 servings per week. One serving of dried beans or legumes equals  cup (90 g) cooked, 1 serving of nuts is  oz (12 almonds, 24 pistachios, or 7 walnut halves), and 1 serving of seeds equals  oz (8 g). Fats Choose healthy fats more often. Choose monounsaturated and polyunsaturated fats, such as olive and canola oils, avocado oil, flaxseeds, walnuts, almonds, and seeds. Eat  more omega-3 fats. Choose salmon, mackerel, sardines, tuna, flaxseed oil, and ground flaxseeds. Aim to eat fish at least 2 times each week. Check food labels carefully to identify foods with trans fats or high amounts of saturated fat. Limit saturated fats. These are found in animal products, such as meats, butter, and cream. Plant sources of saturated fats  include palm oil, palm kernel oil, and coconut oil. Avoid foods with partially hydrogenated oils in them. These contain trans fats. Examples are stick margarine, some tub margarines, cookies, crackers, and other baked goods. Avoid fried foods. General information Eat more home-cooked food and less restaurant, buffet, and fast food. Limit or avoid alcohol. Limit foods that are high in added sugar and simple starches such as foods made using white refined flour (white breads, pastries, sweets). Lose weight if you are overweight. Losing just 5-10% of your body weight can help your overall health and prevent diseases such as diabetes and heart disease. Monitor your sodium intake, especially if you have high blood pressure. Talk with your health care provider about your sodium intake. Try to incorporate more vegetarian meals weekly. What foods should I eat? Fruits All fresh, canned (in natural juice), or frozen fruits. Vegetables Fresh or frozen vegetables (raw, steamed, roasted, or grilled). Green salads. Grains Most grains. Choose whole wheat and whole grains most of the time. Rice and pasta, including brown rice and pastas made with whole wheat. Meats and other proteins Lean, well-trimmed beef, veal, pork, and lamb. Chicken and [place] without skin. All fish and shellfish. Wild duck, rabbit, pheasant, and venison. Egg whites or low-cholesterol egg substitutes. Dried beans, peas, lentils, and tofu. Seeds and most nuts. Dairy Low-fat or nonfat cheeses, including ricotta and mozzarella. Skim or 1% milk (liquid, powdered, or evaporated). Buttermilk made with low-fat milk. Nonfat or low-fat yogurt. Fats and oils Non-hydrogenated (trans-free) margarines. Vegetable oils, including soybean, sesame, sunflower, olive, avocado, peanut, safflower, corn, canola, and cottonseed. Salad dressings or mayonnaise made with a vegetable oil. Beverages Water (mineral or sparkling). Coffee and tea. Unsweetened ice  tea. Diet beverages. Sweets and desserts Sherbet, gelatin, and fruit ice. Small amounts of dark chocolate. Limit all sweets and desserts. Seasonings and condiments All seasonings and condiments. The items listed above may not be a complete list of foods and beverages you can eat. Contact a dietitian for more options. What foods should I avoid? Fruits Canned fruit in heavy syrup. Fruit in cream or butter sauce. Fried fruit. Limit coconut. Vegetables Vegetables cooked in cheese, cream, or butter sauce. Fried vegetables. Grains Breads made with saturated or trans fats, oils, or whole milk. Croissants. Sweet rolls. Donuts. High-fat crackers, such as cheese crackers and chips. Meats and other proteins Fatty meats, such as hot dogs, ribs, sausage, bacon, rib-eye roast or steak. High-fat deli meats, such as salami and bologna. Caviar. Domestic duck and goose. Organ meats, such as liver. Dairy Cream, sour cream, cream cheese, and creamed cottage cheese. Whole-milk cheeses. Whole or 2% milk (liquid, evaporated, or condensed). Whole buttermilk. Cream sauce or high-fat cheese sauce. Whole-milk yogurt. Fats and oils Meat fat, or shortening. Cocoa butter, hydrogenated oils, palm oil, coconut oil, palm kernel oil. Solid fats and shortenings, including bacon fat, salt pork, lard, and butter. Nondairy cream substitutes. Salad dressings with cheese or sour cream. Beverages Regular sodas and any drinks with added sugar. Sweets and desserts Frosting. Pudding. Cookies. Cakes. Pies. Milk chocolate or white chocolate. Buttered syrups. Full-fat ice cream or ice cream drinks. The items listed above may  not be a complete list of foods and beverages to avoid. Contact a dietitian for more information. Summary Heart-healthy meal planning includes limiting unhealthy fats, increasing healthy fats, limiting salt (sodium) intake and making other diet and lifestyle changes. Lose weight if you are overweight. Losing just  5-10% of your body weight can help your overall health and prevent diseases such as diabetes and heart disease. Focus on eating a balance of foods, including fruits and vegetables, low-fat or nonfat dairy, lean protein, nuts and legumes, whole grains, and heart-healthy oils and fats. This information is not intended to replace advice given to you by your health care provider. Make sure you discuss any questions you have with your health care provider. Document Revised: 05/30/2021 Document Reviewed: 05/30/2021 Elsevier Patient Education  2024 [employer].

## 2023-05-21 ENCOUNTER — Encounter: Payer: Self-pay | Admitting: Nurse Practitioner

## 2023-05-21 ENCOUNTER — Ambulatory Visit (INDEPENDENT_AMBULATORY_CARE_PROVIDER_SITE_OTHER): Payer: Managed Care, Other (non HMO) | Admitting: Nurse Practitioner

## 2023-05-21 VITALS — BP 136/72 | HR 94 | Temp 98.0°F | Resp 18 | Ht 66.5 in | Wt 153.0 lb

## 2023-05-21 DIAGNOSIS — Z23 Encounter for immunization: Secondary | ICD-10-CM

## 2023-05-21 DIAGNOSIS — I10 Essential (primary) hypertension: Secondary | ICD-10-CM | POA: Diagnosis not present

## 2023-05-21 DIAGNOSIS — E782 Mixed hyperlipidemia: Secondary | ICD-10-CM

## 2023-05-21 DIAGNOSIS — C61 Malignant neoplasm of prostate: Secondary | ICD-10-CM

## 2023-05-21 DIAGNOSIS — Z8673 Personal history of transient ischemic attack (TIA), and cerebral infarction without residual deficits: Secondary | ICD-10-CM | POA: Diagnosis not present

## 2023-05-21 DIAGNOSIS — L989 Disorder of the skin and subcutaneous tissue, unspecified: Secondary | ICD-10-CM | POA: Insufficient documentation

## 2023-05-21 NOTE — Assessment & Plan Note (Signed)
 To left mid side, suspicious in nature due to alternating color and darker aspect that is present.  Referral to dermatology placed.

## 2023-05-21 NOTE — Assessment & Plan Note (Signed)
Chronic, stable.  Continue current medication regimen and adjust as needed. Will check Lipid panel in January and if LDL >70 will add on Zetia.

## 2023-05-21 NOTE — Assessment & Plan Note (Signed)
 On 02/15/23, no further incidents and work up in ER was reassuring.  Monitor closely and ensure LDL is at goal and BP.  Continue to collaborate with cardiology.

## 2023-05-21 NOTE — Assessment & Plan Note (Signed)
 Chronic, stable.  BP at goal in office on recheck and on average at home is within goal range.  Recommend he monitor BP at least a few mornings a week at home and document.  DASH diet at home.  Continue Lisinopril  40 MG daily, alert provider if elevations consistently >130/80. Consider 12.5 MG HCTZ if elevations, 25 MG caused hypotension OR Amlodipine.  Labs today: BMP.

## 2023-05-21 NOTE — Assessment & Plan Note (Signed)
 Ongoing with watchful waiting.  Continue to collaborate with urology, recent labs and notes reviewed.

## 2023-05-21 NOTE — Progress Notes (Signed)
 BP 136/72 (BP Location: Left Arm)   Pulse 94   Temp 98 F (36.7 C) (Oral)   Resp 18   Ht 5' 6.5 (1.689 m)   Wt 153 lb (69.4 kg)   SpO2 97%   BMI 24.32 kg/m    Subjective:    Patient ID: Warren Richardson, male    DOB: 14-Feb-1956, 68 y.o.   MRN: [id number]  HPI: Warren Richardson is a 68 y.o. male  Chief Complaint  Patient presents with   Hyperlipidemia   Hypertension   HYPERTENSION / HYPERLIPIDEMIA Continues on Lisinopril  40 MG daily and Rosuvastatin  40 MG daily.  Watched football and drank a little too much beer last night.  Follows with urology for prostate cancer, last visit 02/28/23 to repeat PSA every 6 month with them.  History of TIA 02/15/23 and went to [facility] for evaluation of this. CT noted no acute findings and Carotid doppler noted R and L ICA with less then 50%.  Had visit with cardiology last on 03/29/23 with no changes, to return in one year. Satisfied with current treatment? yes Duration of hypertension: chronic BP monitoring frequency: daily at work BP range: 100/60 to 157/86, on average <130/80 and only 6 BP above goal BP medication side effects: no Duration of hyperlipidemia: chronic Cholesterol medication side effects: no Cholesterol supplements: fish oil Medication compliance: good compliance Aspirin: yes Recent stressors: no Recurrent headaches: no Visual changes: no Palpitations: no Dyspnea: no Chest pain: no Lower extremity edema: no Dizzy/lightheaded: no   Relevant past medical, surgical, family and social history reviewed and updated as indicated. Interim medical history since our last visit reviewed. Allergies and medications reviewed and updated.  Review of Systems  Constitutional:  Negative for activity change, diaphoresis, fatigue and fever.  Respiratory:  Negative for cough, chest tightness, shortness of breath and wheezing.   Cardiovascular:  Negative for chest pain, palpitations and leg swelling.  Gastrointestinal: Negative.    Endocrine: Negative for cold intolerance and heat intolerance.  Neurological: Negative.   Psychiatric/Behavioral: Negative.      Per HPI unless specifically indicated above     Objective:    BP 136/72 (BP Location: Left Arm)   Pulse 94   Temp 98 F (36.7 C) (Oral)   Resp 18   Ht 5' 6.5 (1.689 m)   Wt 153 lb (69.4 kg)   SpO2 97%   BMI 24.32 kg/m   Wt Readings from Last 3 Encounters:  05/21/23 153 lb (69.4 kg)  03/05/23 151 lb 6.4 oz (68.7 kg)  02/28/23 152 lb (68.9 kg)    Physical Exam Vitals and nursing note reviewed.  Constitutional:      General: He is awake. He is not in acute distress.    Appearance: He is well-developed and well-groomed. He is not ill-appearing or toxic-appearing.  HENT:     Head: Normocephalic.     Right Ear: Hearing and external ear normal.     Left Ear: Hearing and external ear normal.  Eyes:     General: Lids are normal.     Extraocular Movements: Extraocular movements intact.     Conjunctiva/sclera: Conjunctivae normal.  Neck:     Thyroid: No thyromegaly.     Vascular: No carotid bruit.  Cardiovascular:     Rate and Rhythm: Normal rate and regular rhythm.     Heart sounds: Normal heart sounds. No murmur heard.    No gallop.  Pulmonary:     Effort: No accessory muscle usage  or respiratory distress.     Breath sounds: Normal breath sounds.  Abdominal:     General: Bowel sounds are normal. There is no distension.     Palpations: Abdomen is soft.     Tenderness: There is no abdominal tenderness.  Musculoskeletal:     Cervical back: Full passive range of motion without pain.     Right lower leg: No edema.     Left lower leg: No edema.  Lymphadenopathy:     Cervical: No cervical adenopathy.  Skin:    General: Skin is warm.     Capillary Refill: Capillary refill takes less than 2 seconds.       Neurological:     Mental Status: He is alert and oriented to person, place, and time.     Deep Tendon Reflexes: Reflexes are normal and  symmetric.     Reflex Scores:      Brachioradialis reflexes are 2+ on the right side and 2+ on the left side.      Patellar reflexes are 2+ on the right side and 2+ on the left side. Psychiatric:        Attention and Perception: Attention normal.        Mood and Affect: Mood normal.        Speech: Speech normal.        Behavior: Behavior normal. Behavior is cooperative.        Thought Content: Thought content normal.    Results for orders placed or performed in visit on 03/05/23  Basic metabolic panel   Collection Time: 03/05/23  8:41 AM  Result Value Ref Range   Glucose 159 (H) 70 - 99 mg/dL   BUN 16 8 - 27 mg/dL   Creatinine, Ser 8.90 0.76 - 1.27 mg/dL   eGFR 74 >40 fO/fpw/8.26   BUN/Creatinine Ratio 15 10 - 24   Sodium 138 134 - 144 mmol/L   Potassium 4.4 3.5 - 5.2 mmol/L   Chloride 98 96 - 106 mmol/L   CO2 22 20 - 29 mmol/L   Calcium  9.3 8.6 - 10.2 mg/dL      Assessment & Plan:   Problem List Items Addressed This Visit       Cardiovascular and Mediastinum   Benign essential hypertension   Chronic, stable.  BP at goal in office on recheck and on average at home is within goal range.  Recommend he monitor BP at least a few mornings a week at home and document.  DASH diet at home.  Continue Lisinopril  40 MG daily, alert provider if elevations consistently >130/80. Consider 12.5 MG HCTZ if elevations, 25 MG caused hypotension OR Amlodipine.  Labs today: BMP.        Relevant Orders   Basic metabolic panel     Musculoskeletal and Integument   Skin lesion   To left mid side, suspicious in nature due to alternating color and darker aspect that is present.  Referral to dermatology placed.      Relevant Orders   Ambulatory referral to Dermatology     Genitourinary   Prostate cancer [facility]) - Primary   Ongoing with watchful waiting.  Continue to collaborate with urology, recent labs and notes reviewed.        Other   History of TIA (transient ischemic attack)   On  02/15/23, no further incidents and work up in ER was reassuring.  Monitor closely and ensure LDL is at goal and BP.  Continue to collaborate with cardiology.  Mixed hyperlipidemia   Chronic, stable.  Continue current medication regimen and adjust as needed. Will check Lipid panel in January and if LDL >70 will add on Zetia.      Relevant Orders   Lipid Panel w/o Chol/HDL Ratio     Follow up plan: Return in about 6 months (around 11/18/2023) for Annual Physical after 11/16/23 is due.

## 2023-05-22 LAB — BASIC METABOLIC PANEL
BUN/Creatinine Ratio: 19 (ref 10–24)
BUN: 20 mg/dL (ref 8–27)
CO2: 21 mmol/L (ref 20–29)
Calcium: 9.2 mg/dL (ref 8.6–10.2)
Chloride: 98 mmol/L (ref 96–106)
Creatinine, Ser: 1.06 mg/dL (ref 0.76–1.27)
Glucose: 78 mg/dL (ref 70–99)
Potassium: 4.4 mmol/L (ref 3.5–5.2)
Sodium: 137 mmol/L (ref 134–144)
eGFR: 77 mL/min/{1.73_m2} (ref >59)

## 2023-05-22 LAB — LIPID PANEL W/O CHOL/HDL RATIO
Cholesterol, Total: 190 mg/dL (ref 100–199)
HDL: 85 mg/dL (ref >39)
LDL Chol Calc (NIH): 93 mg/dL (ref 0–99)
Triglycerides: 67 mg/dL (ref 0–149)
VLDL Cholesterol Cal: 12 mg/dL (ref 5–40)

## 2023-05-22 NOTE — Progress Notes (Signed)
 Contacted via MyChart   Good morning, labs remain overall stable.  Any questions? Keep being amazing!!  Thank you for allowing me to participate in your care.  I appreciate you. Kindest regards, Atif Chapple

## 2023-07-27 ENCOUNTER — Encounter: Payer: Self-pay | Admitting: Nurse Practitioner

## 2023-07-27 DIAGNOSIS — C61 Malignant neoplasm of prostate: Secondary | ICD-10-CM

## 2023-07-30 ENCOUNTER — Encounter: Payer: Self-pay | Admitting: Urology

## 2023-07-30 NOTE — Telephone Encounter (Signed)
 Patient has PSA lab scheduled on 08-27-23 with [facility]. He will contact us if he want's to reschedule with [facility].

## 2023-08-02 ENCOUNTER — Other Ambulatory Visit: Payer: Self-pay | Admitting: Nurse Practitioner

## 2023-08-03 NOTE — Telephone Encounter (Signed)
 Requested Prescriptions  Pending Prescriptions Disp Refills   lisinopril (ZESTRIL) 40 MG tablet [Pharmacy Med Name: LISINOPRIL 40 MG TABLET] 90 tablet 4    Sig: TAKE 1 TABLET BY MOUTH EVERY DAY     Cardiovascular:  ACE Inhibitors Failed - 08/03/2023 10:31 AM      Failed - Valid encounter within last 6 months    Recent Outpatient Visits   None     Future Appointments             In 1 month Vanna Scotland, MD [facility] [place]   In 4 months [place], [place] T, NP [place] [facility]            Passed - Cr in normal range and within 180 days    Creatinine, Ser  Date Value Ref Range Status  05/21/2023 1.06 0.76 - 1.27 mg/dL Final         Passed - K in normal range and within 180 days    Potassium  Date Value Ref Range Status  05/21/2023 4.4 3.5 - 5.2 mmol/L Final         Passed - Patient is not pregnant      Passed - Last BP in normal range    BP Readings from Last 1 Encounters:  05/21/23 136/72          rosuvastatin (CRESTOR) 40 MG tablet [Pharmacy Med Name: ROSUVASTATIN CALCIUM 40 MG TAB] 90 tablet 3    Sig: TAKE 1 TABLET BY MOUTH EVERY DAY     Cardiovascular:  Antilipid - Statins 2 Failed - 08/03/2023 10:31 AM      Failed - Valid encounter within last 12 months    Recent Outpatient Visits   None     Future Appointments             In 1 month Vanna Scotland, MD [facility] [place]   In 4 months [place], Stella T, NP [place] [facility]            Failed - Lipid Panel in normal range within the last 12 months    Cholesterol, Total  Date Value Ref Range Status  05/21/2023 190 100 - 199 mg/dL Final   LDL Chol Calc (NIH)  Date Value Ref Range Status  05/21/2023 93 0 - 99 mg/dL Final   HDL  Date Value Ref Range Status  05/21/2023 85 >39 mg/dL Final   Triglycerides  Date Value Ref Range Status  05/21/2023 67 0 - 149 mg/dL Final         Passed - Cr in normal range and within 360  days    Creatinine, Ser  Date Value Ref Range Status  05/21/2023 1.06 0.76 - 1.27 mg/dL Final         Passed - Patient is not pregnant

## 2023-08-27 ENCOUNTER — Ambulatory Visit
Admission: RE | Admit: 2023-08-27 | Discharge: 2023-08-27 | Disposition: A | Discharge status: Home / Self Care | Source: Ambulatory Visit | Attending: Urology | Admitting: Urology

## 2023-08-27 ENCOUNTER — Other Ambulatory Visit: Payer: Self-pay

## 2023-08-27 DIAGNOSIS — N401 Enlarged prostate with lower urinary tract symptoms: Secondary | ICD-10-CM

## 2023-08-27 DIAGNOSIS — C61 Malignant neoplasm of prostate: Secondary | ICD-10-CM

## 2023-08-27 MED ORDER — GADOBUTROL 1 MMOL/ML IV SOLN
7.0000 mL | Freq: Once | INTRAVENOUS | Status: AC | PRN
Start: 2023-08-27 — End: 2023-08-27
  Administered 2023-08-27: 7 mL via INTRAVENOUS

## 2023-08-28 LAB — PSA: Prostate Specific Ag, Serum: 5.2 ng/mL — ABNORMAL HIGH (ref 0.0–4.0)

## 2023-09-12 ENCOUNTER — Ambulatory Visit: Payer: Self-pay | Admitting: Urology

## 2023-10-02 ENCOUNTER — Ambulatory Visit (INDEPENDENT_AMBULATORY_CARE_PROVIDER_SITE_OTHER): Admitting: Urology

## 2023-10-02 VITALS — BP 179/82 | HR 99 | Ht 66.0 in | Wt 148.2 lb

## 2023-10-02 DIAGNOSIS — C61 Malignant neoplasm of prostate: Secondary | ICD-10-CM | POA: Diagnosis not present

## 2023-10-02 NOTE — Progress Notes (Signed)
 I,Amy L Pierron,acting as a scribe for Dustin Gimenez, MD.,have documented all relevant documentation on the behalf of Dustin Gimenez, MD,as directed by  Dustin Gimenez, MD while in the presence of Dustin Gimenez, MD.  10/02/2023 3:42 PM   Warren Richardson [birth year] [id number]  Referring provider: Lemar Pyles, NP [address] [place],  [place] [address]  Chief Complaint  Patient presents with   Benign Prostatic Hypertrophy   Prostate Cancer    HPI: 68 year-old male with a personal history of low-risk prostate cancer on active surveillance returns today for six month follow-up.  He underwent a complicated prostate biopsy on 04/23/2023 which showed a volume of 55.2 grams with some asimmetry. The left side of the prostate was greater than the right.    Prostate biopsy revealed 2 cores of Gleason 3+3 up to 18% involving the left lateral base. He also had a suspicious focus of atypical glands at the left lateral apex, but not diagnostic. Along with other areas of pin, he also had focal inflammation.  His most recent PSA on 08/27/2023 is 5.2, which is stable. A prostate MRI conducted in April showed a 40.5 gram prostate with mild prostatomegaly, otherwise unremarkable, without any discrete lesions. The MRI findings are reassuring, with no lesions detected, placing him in a low-risk category for hidden high-risk prostate cancer.   He reports no urinary issues. He is doing well overall with no new symptoms or complaints.   PMH: Past Medical History:  Diagnosis Date   Hyperlipidemia    Hypertension     Home Medications:  Allergies as of 10/02/2023       Reactions   Atorvastatin Other (See Comments)   Increased liver enzymes        Medication List        Accurate as of Oct 02, 2023  3:42 PM. If you have any questions, ask your nurse or doctor.          aspirin 81 MG chewable tablet Chew 81 mg by mouth daily.   BIOTIN PO Take 250 mg by mouth daily.   BLACK  ELDERBERRY PO Take by mouth.   Fish Oil 1000 MG Caps Take 1,000 mg by mouth 2 (two) times daily.   GINGER ROOT PO 1 Dose once daily   glucosamine-chondroitin 500-400 MG tablet Take 1 tablet by mouth 2 (two) times daily.   lisinopril  40 MG tablet Commonly known as: ZESTRIL  TAKE 1 TABLET BY MOUTH EVERY DAY   Multi-Vitamins Tabs Take 1 tablet by mouth daily.   rosuvastatin  40 MG tablet Commonly known as: CRESTOR  TAKE 1 TABLET BY MOUTH EVERY DAY   SAW PALMETTO PO Take by mouth.        Allergies:  Allergies  Allergen Reactions   Atorvastatin Other (See Comments)    Increased liver enzymes    Family History: Family History  Problem Relation Age of Onset   Arthritis Mother    Stroke Mother    Dementia Mother    Cancer Father        prostate   Diabetes Father    Hyperlipidemia Father    Hypertension Father    Cancer Sister        breast    Social History:  reports that he has never smoked. He has never been exposed to tobacco smoke. He has never used smokeless tobacco. He reports current alcohol use of about 1.0 - 2.0 standard drink of alcohol per week. He reports that he does not  use drugs.   Physical Exam: BP (!) 179/82   Pulse 99   Ht 5\' 6"  (1.676 m)   Wt 148 lb 4 oz (67.2 kg)   BMI 23.93 kg/m   Constitutional:  Alert and oriented, No acute distress. HEENT: [place] AT, moist mucus membranes.  Trachea midline, no masses. Neurologic: Grossly intact, no focal deficits, moving all 4 extremities. Psychiatric: Normal mood and affect.   Pertinent Imaging: Narrative & Impression  CLINICAL DATA:  Low risk prostate cancer under active surveillance. Prior biopsy 04/23/2023 reportedly revealed Gleason 3+3=6 prostate adenocarcinoma in the left lateral base and left lateral mid gland.   EXAM: MR PROSTATE WITHOUT AND WITH CONTRAST   TECHNIQUE: Multiplanar multisequence MRI images were obtained of the pelvis centered about the prostate. Pre and post contrast  images were obtained.   CONTRAST:  7mL GADAVIST  GADOBUTROL  1 MMOL/ML IV SOLN   COMPARISON:  None Available.   FINDINGS: Prostate: Encapsulated nodularity in the transition zone compatible with benign prostatic hypertrophy.   Hazy low T2 signal in the peripheral zone is nonfocal, likely postinflammatory, and is considered PI-RADS category 2.   No focal lesion of intermediate or higher suspicion for prostate cancer is identified.   Volume: 3D volumetric analysis: Prostate volume 40.5 cc (5.6 by 4.2 by 4.0 cm).   Transcapsular spread: Absent   Seminal vesicle involvement: Absent   Neurovascular bundle involvement: Absent   Pelvic adenopathy: Absent   Bone metastasis: Absent   Other findings: No supplemental non-categorized findings.   IMPRESSION: 1. No focal lesion of intermediate or higher suspicion for prostate cancer is identified. 2. Mild prostatomegaly and benign prostatic hypertrophy.   Electronically Signed   By: Freida Jes M.D.   On: 08/30/2023 15:06  Personally reviewed above images and agree with radiologic interpretation.    Assessment & Plan:    1. Low risk prostate cancer  - Recommend continuing active surveillance including rectal exam at least annually and PSA at least bi-annually.  A confirmatory biopsy may be considered in the future if PSA levels change, but not necessary at this time due to stable PSA and reassuring MRI findings.  Return in about 6 months (around 04/03/2024) for PSA, DRE.  I have reviewed the above documentation for accuracy and completeness, and I agree with the above.   Dustin Gimenez, MD   [facility] [address] [place], [place] [address] [phone]

## 2023-12-15 NOTE — Patient Instructions (Signed)
 Be Involved in Caring For Your Health:  Taking Medications When medications are taken as directed, they can greatly improve your health. But if they are not taken as prescribed, they may not work. In some cases, not taking them correctly can be harmful. To help ensure your treatment remains effective and safe, understand your medications and how to take them. Bring your medications to each visit for review by your provider.  Your lab results, notes, and after visit summary will be available on My Chart. We strongly encourage you to use this feature. If lab results are abnormal the clinic will contact you with the appropriate steps. If the clinic does not contact you assume the results are satisfactory. You can always view your results on My Chart. If you have questions regarding your health or results, please contact the clinic during office hours. You can also ask questions on My Chart.  We at [facility] are grateful that you chose [place] to provide your care. We strive to provide evidence-based and compassionate care and are always looking for feedback. If you get a survey from the clinic please complete this so we can hear your opinions.  Heart-Healthy Eating Plan Many factors influence your heart health, including eating and exercise habits. Heart health is also called coronary health. Coronary risk increases with abnormal blood fat (lipid) levels. A heart-healthy eating plan includes limiting unhealthy fats, increasing healthy fats, limiting salt (sodium) intake, and making other diet and lifestyle changes. What is my plan? Your health care provider may recommend that: You limit your fat intake to _________% or less of your total calories each day. You limit your saturated fat intake to _________% or less of your total calories each day. You limit the amount of cholesterol in your diet to less than _________ mg per day. You limit the amount of sodium in your diet to less than _________  mg per day. What are tips for following this plan? Cooking Cook foods using methods other than frying. Baking, boiling, grilling, and broiling are all good options. Other ways to reduce fat include: Removing the skin from poultry. Removing all visible fats from meats. Steaming vegetables in water or broth. Meal planning  At meals, imagine dividing your plate into fourths: Fill one-half of your plate with vegetables and green salads. Fill one-fourth of your plate with whole grains. Fill one-fourth of your plate with lean protein foods. Eat 2-4 cups of vegetables per day. One cup of vegetables equals 1 cup (91 g) broccoli or cauliflower florets, 2 medium carrots, 1 large bell pepper, 1 large sweet potato, 1 large tomato, 1 medium white potato, 2 cups (150 g) raw leafy greens. Eat 1-2 cups of fruit per day. One cup of fruit equals 1 small apple, 1 large banana, 1 cup (237 g) mixed fruit, 1 large orange,  cup (82 g) dried fruit, 1 cup (240 mL) 100% fruit juice. Eat more foods that contain soluble fiber. Examples include apples, broccoli, carrots, beans, peas, and barley. Aim to get 25-30 g of fiber per day. Increase your consumption of legumes, nuts, and seeds to 4-5 servings per week. One serving of dried beans or legumes equals  cup (90 g) cooked, 1 serving of nuts is  oz (12 almonds, 24 pistachios, or 7 walnut halves), and 1 serving of seeds equals  oz (8 g). Fats Choose healthy fats more often. Choose monounsaturated and polyunsaturated fats, such as olive and canola oils, avocado oil, flaxseeds, walnuts, almonds, and seeds. Eat  more omega-3 fats. Choose salmon, mackerel, sardines, tuna, flaxseed oil, and ground flaxseeds. Aim to eat fish at least 2 times each week. Check food labels carefully to identify foods with trans fats or high amounts of saturated fat. Limit saturated fats. These are found in animal products, such as meats, butter, and cream. Plant sources of saturated fats  include palm oil, palm kernel oil, and coconut oil. Avoid foods with partially hydrogenated oils in them. These contain trans fats. Examples are stick margarine, some tub margarines, cookies, crackers, and other baked goods. Avoid fried foods. General information Eat more home-cooked food and less restaurant, buffet, and fast food. Limit or avoid alcohol. Limit foods that are high in added sugar and simple starches such as foods made using white refined flour (white breads, pastries, sweets). Lose weight if you are overweight. Losing just 5-10% of your body weight can help your overall health and prevent diseases such as diabetes and heart disease. Monitor your sodium intake, especially if you have high blood pressure. Talk with your health care provider about your sodium intake. Try to incorporate more vegetarian meals weekly. What foods should I eat? Fruits All fresh, canned (in natural juice), or frozen fruits. Vegetables Fresh or frozen vegetables (raw, steamed, roasted, or grilled). Green salads. Grains Most grains. Choose whole wheat and whole grains most of the time. Rice and pasta, including brown rice and pastas made with whole wheat. Meats and other proteins Lean, well-trimmed beef, veal, pork, and lamb. Chicken and [place] without skin. All fish and shellfish. Wild duck, rabbit, pheasant, and venison. Egg whites or low-cholesterol egg substitutes. Dried beans, peas, lentils, and tofu. Seeds and most nuts. Dairy Low-fat or nonfat cheeses, including ricotta and mozzarella. Skim or 1% milk (liquid, powdered, or evaporated). Buttermilk made with low-fat milk. Nonfat or low-fat yogurt. Fats and oils Non-hydrogenated (trans-free) margarines. Vegetable oils, including soybean, sesame, sunflower, olive, avocado, peanut, safflower, corn, canola, and cottonseed. Salad dressings or mayonnaise made with a vegetable oil. Beverages Water (mineral or sparkling). Coffee and tea. Unsweetened ice  tea. Diet beverages. Sweets and desserts Sherbet, gelatin, and fruit ice. Small amounts of dark chocolate. Limit all sweets and desserts. Seasonings and condiments All seasonings and condiments. The items listed above may not be a complete list of foods and beverages you can eat. Contact a dietitian for more options. What foods should I avoid? Fruits Canned fruit in heavy syrup. Fruit in cream or butter sauce. Fried fruit. Limit coconut. Vegetables Vegetables cooked in cheese, cream, or butter sauce. Fried vegetables. Grains Breads made with saturated or trans fats, oils, or whole milk. Croissants. Sweet rolls. Donuts. High-fat crackers, such as cheese crackers and chips. Meats and other proteins Fatty meats, such as hot dogs, ribs, sausage, bacon, rib-eye roast or steak. High-fat deli meats, such as salami and bologna. Caviar. Domestic duck and goose. Organ meats, such as liver. Dairy Cream, sour cream, cream cheese, and creamed cottage cheese. Whole-milk cheeses. Whole or 2% milk (liquid, evaporated, or condensed). Whole buttermilk. Cream sauce or high-fat cheese sauce. Whole-milk yogurt. Fats and oils Meat fat, or shortening. Cocoa butter, hydrogenated oils, palm oil, coconut oil, palm kernel oil. Solid fats and shortenings, including bacon fat, salt pork, lard, and butter. Nondairy cream substitutes. Salad dressings with cheese or sour cream. Beverages Regular sodas and any drinks with added sugar. Sweets and desserts Frosting. Pudding. Cookies. Cakes. Pies. Milk chocolate or white chocolate. Buttered syrups. Full-fat ice cream or ice cream drinks. The items listed above may  not be a complete list of foods and beverages to avoid. Contact a dietitian for more information. Summary Heart-healthy meal planning includes limiting unhealthy fats, increasing healthy fats, limiting salt (sodium) intake and making other diet and lifestyle changes. Lose weight if you are overweight. Losing just  5-10% of your body weight can help your overall health and prevent diseases such as diabetes and heart disease. Focus on eating a balance of foods, including fruits and vegetables, low-fat or nonfat dairy, lean protein, nuts and legumes, whole grains, and heart-healthy oils and fats. This information is not intended to replace advice given to you by your health care provider. Make sure you discuss any questions you have with your health care provider. Document Revised: 05/30/2021 Document Reviewed: 05/30/2021 Elsevier Patient Education  2024 [employer].

## 2023-12-17 ENCOUNTER — Encounter: Payer: Self-pay | Admitting: Nurse Practitioner

## 2023-12-17 ENCOUNTER — Ambulatory Visit (INDEPENDENT_AMBULATORY_CARE_PROVIDER_SITE_OTHER): Payer: Self-pay | Admitting: Nurse Practitioner

## 2023-12-17 VITALS — BP 128/74 | HR 91 | Temp 98.3°F | Ht 65.0 in | Wt 148.4 lb

## 2023-12-17 DIAGNOSIS — Z8673 Personal history of transient ischemic attack (TIA), and cerebral infarction without residual deficits: Secondary | ICD-10-CM

## 2023-12-17 DIAGNOSIS — I071 Rheumatic tricuspid insufficiency: Secondary | ICD-10-CM

## 2023-12-17 DIAGNOSIS — Z0000 Encounter for general adult medical examination without abnormal findings: Secondary | ICD-10-CM

## 2023-12-17 DIAGNOSIS — I10 Essential (primary) hypertension: Secondary | ICD-10-CM

## 2023-12-17 DIAGNOSIS — D508 Other iron deficiency anemias: Secondary | ICD-10-CM

## 2023-12-17 DIAGNOSIS — C61 Malignant neoplasm of prostate: Secondary | ICD-10-CM | POA: Diagnosis not present

## 2023-12-17 DIAGNOSIS — E782 Mixed hyperlipidemia: Secondary | ICD-10-CM

## 2023-12-17 DIAGNOSIS — I340 Nonrheumatic mitral (valve) insufficiency: Secondary | ICD-10-CM | POA: Diagnosis not present

## 2023-12-17 MED ORDER — ROSUVASTATIN CALCIUM 40 MG PO TABS: 40.0000 mg | ORAL_TABLET | Freq: Every day | ORAL | 4 refills | Status: AC

## 2023-12-17 MED ORDER — LISINOPRIL 40 MG PO TABS: 40.0000 mg | ORAL_TABLET | Freq: Every day | ORAL | 4 refills | Status: AC

## 2023-12-17 NOTE — Assessment & Plan Note (Signed)
 Ongoing, mild since June 2021. Normal iron level + negative FOBT past checks.  Is very active at baseline, suspect this may be reason for mild low levels.  Continue supplements at home and monitor.  If any significant decline will get into GI or hematology for further evaluation.  CBC, iron, ferritin today.

## 2023-12-17 NOTE — Assessment & Plan Note (Addendum)
 On 02/15/23, no further incidents and work up in ER was reassuring at the time.  Monitor closely and ensure LDL is at goal (55 or less) and BP.  Continue to collaborate with cardiology.

## 2023-12-17 NOTE — Assessment & Plan Note (Signed)
 Ongoing with watchful waiting.  Continue to collaborate with urology, recent labs and notes reviewed.

## 2023-12-17 NOTE — Assessment & Plan Note (Signed)
 Followed by cardiology, continue this collaboration. ?

## 2023-12-17 NOTE — Progress Notes (Signed)
 BP 128/74 (BP Location: Left Arm, Patient Position: Sitting)   Pulse 91   Temp 98.3 F (36.8 C) (Oral)   Ht 5' 5 (1.651 m)   Wt 148 lb 6.4 oz (67.3 kg)   SpO2 97%   BMI 24.70 kg/m    Subjective:    Patient ID: [name], male    DOB: Jun 13, 1955, 68 y.o.   MRN: [id number]  HPI: Warren Richardson is a 68 y.o. male presenting on 12/17/2023 for comprehensive medical examination. Current medical complaints include: none  He currently lives with: self Interim Problems from his last visit: none  Continues to follow with urology for prostate cancer, last visit was on 10/02/23.  They continue to perform active surveillance.   HYPERTENSION / HYPERLIPIDEMIA Continues on Lisinopril  and Crestor  + ASA.  Saw cardiology last on 03/29/23, due to mitral and tricuspid regurgitation. Last echo was 2019 - EF 55%.  He follows with them annually.  Satisfied with current treatment? yes Duration of hypertension: chronic BP monitoring frequency: a few times a month BP range: 96/65 to 154/81 -- occasional SBP >140, average 110-120/70 BP medication side effects: no Duration of hyperlipidemia: chronic Cholesterol medication side effects: no Cholesterol supplements: none Medication compliance: good compliance Aspirin: yes Recent stressors: no Recurrent headaches: no Visual changes: no Palpitations: no Dyspnea: no Chest pain: no Lower extremity edema: no Dizzy/lightheaded: no   ANEMIA History of lower hemoglobin and iron on labs = he is very active at baseline, is taking iron daily.  Will be going for colonoscopy in the new year, has visit with GI in January. Anemia status: stable Etiology of anemia: Duration of anemia treatment:  Compliance with treatment: good compliance Iron supplementation side effects: no Severity of anemia: mild Fatigue: no Decreased exercise tolerance: no  Dyspnea on exertion: no Palpitations: no Bleeding: no Pica: no   Functional Status Survey: Is the  patient deaf or have difficulty hearing?: No Does the patient have difficulty seeing, even when wearing glasses/contacts?: No Does the patient have difficulty concentrating, remembering, or making decisions?: No Does the patient have difficulty walking or climbing stairs?: No Does the patient have difficulty dressing or bathing?: No Does the patient have difficulty doing errands alone such as visiting a doctor's office or shopping?: No  FALL RISK:    12/17/2023    8:22 AM 05/21/2023    8:13 AM 11/16/2022    8:25 AM 06/15/2022    9:30 AM 05/17/2022    8:14 AM  Fall Risk   Falls in the past year? 0 0 0 0 0  Number falls in past yr: 0 0 0 0 0  Injury with Fall? 0 0 0 0 0  Risk for fall due to : No Fall Risks No Fall Risks No Fall Risks No Fall Risks No Fall Risks  Follow up Falls evaluation completed Falls evaluation completed Falls evaluation completed Falls evaluation completed     Depression Screen    12/17/2023    8:22 AM 05/21/2023    8:14 AM 03/05/2023    8:13 AM 11/16/2022    8:26 AM 06/15/2022    9:31 AM  Depression screen PHQ 2/9  Decreased Interest 0 0 0 0 0  Down, Depressed, Hopeless 0 0 0 0 0  PHQ - 2 Score 0 0 0 0 0  Altered sleeping 0 0 0 0 0  Tired, decreased energy 0 0 0 0 0  Change in appetite 0 0 0 0 0  Feeling bad or  failure about yourself  0 0 0 0 0  Trouble concentrating 0 0 0 0 0  Moving slowly or fidgety/restless 0 0 0 0 0  Suicidal thoughts 0 0 0 0 0  PHQ-9 Score 0 0 0 0 0  Difficult doing work/chores Not difficult at all Not difficult at all  Not difficult at all Not difficult at all      12/17/2023    8:22 AM 05/21/2023    8:14 AM 03/05/2023    8:13 AM 11/16/2022    8:26 AM  GAD 7 : Generalized Anxiety Score  Nervous, Anxious, on Edge 0 0 0 0  Control/stop worrying 0 0 0 0  Worry too much - different things 0 0 0 0  Trouble relaxing 0 0 0 0  Restless 0 0 0 0  Easily annoyed or irritable 0 0 0 0  Afraid - awful might happen 0 0 0 0  Total GAD 7 Score  0 0 0 0  Anxiety Difficulty Not difficult at all Not difficult at all  Not difficult at all      Past Medical History:  Past Medical History:  Diagnosis Date   Hyperlipidemia    Hypertension     Surgical History:  History reviewed. No pertinent surgical history.  Medications:  Current Outpatient Medications on File Prior to Visit  Medication Sig   aspirin 81 MG chewable tablet Chew 81 mg by mouth daily.   BIOTIN PO Take 250 mg by mouth daily.   BLACK ELDERBERRY PO Take by mouth.   Ginger, Zingiber officinalis, (GINGER ROOT PO) 1 Dose once daily   glucosamine-chondroitin 500-400 MG tablet Take 1 tablet by mouth 2 (two) times daily.   Multiple Vitamin (MULTI-VITAMINS) TABS Take 1 tablet by mouth daily.   Omega-3 Fatty Acids (FISH OIL) 1000 MG CAPS Take 1,000 mg by mouth 2 (two) times daily.   Saw Palmetto, Serenoa repens, (SAW PALMETTO PO) Take by mouth.   No current facility-administered medications on file prior to visit.    Allergies:  Allergies  Allergen Reactions   Atorvastatin Other (See Comments)    Increased liver enzymes    Social History:  Social History   Socioeconomic History   Marital status: Single    Spouse name: Not on file   Number of children: Not on file   Years of education: Not on file   Highest education level: Not on file  Occupational History   Not on file  Tobacco Use   Smoking status: Never    Passive exposure: Never   Smokeless tobacco: Never  Vaping Use   Vaping status: Never Used  Substance and Sexual Activity   Alcohol use: Yes    Alcohol/week: 1.0 - 2.0 standard drink of alcohol    Types: 1 - 2 Cans of beer per week   Drug use: No   Sexual activity: Yes  Other Topics Concern   Not on file  Social History Narrative   Not on file   Social Drivers of Health   Financial Resource Strain: Low Risk  (12/17/2023)   Overall Financial Resource Strain (CARDIA)    Difficulty of Paying Living Expenses: Not hard at all  Food  Insecurity: No Food Insecurity (12/17/2023)   Hunger Vital Sign    Worried About Running Out of Food in the Last Year: Never true    Ran Out of Food in the Last Year: Never true  Transportation Needs: No Transportation Needs (12/17/2023)   PRAPARE - Transportation  Lack of Transportation (Medical): No    Lack of Transportation (Non-Medical): No  Physical Activity: Sufficiently Active (12/17/2023)   Exercise Vital Sign    Days of Exercise per Week: 6 days    Minutes of Exercise per Session: 60 min  Stress: No Stress Concern Present (12/17/2023)   [employer] of Occupational Health - Occupational Stress Questionnaire    Feeling of Stress: Only a little  Social Connections: Socially Isolated (12/17/2023)   Social Connection and Isolation Panel    Frequency of Communication with Friends and Family: Twice a week    Frequency of Social Gatherings with Friends and Family: Never    Attends Religious Services: Never    Database administrator or Organizations: Yes    Attends Banker Meetings: Never    Marital Status: Divorced  Catering manager Violence: Not At Risk (12/17/2023)   Humiliation, Afraid, Rape, and Kick questionnaire    Fear of Current or Ex-Partner: No    Emotionally Abused: No    Physically Abused: No    Sexually Abused: No   Social History   Tobacco Use  Smoking Status Never   Passive exposure: Never  Smokeless Tobacco Never   Social History   Substance and Sexual Activity  Alcohol Use Yes   Alcohol/week: 1.0 - 2.0 standard drink of alcohol   Types: 1 - 2 Cans of beer per week    Family History:  Family History  Problem Relation Age of Onset   Arthritis Mother    Stroke Mother    Dementia Mother    Cancer Father        prostate   Diabetes Father    Hyperlipidemia Father    Hypertension Father    Cancer Sister        breast    Past medical history, surgical history, medications, allergies, family history and social history reviewed with  patient today and changes made to appropriate areas of the chart.   ROS All other ROS negative except what is listed above and in the HPI.      Objective:    BP 128/74 (BP Location: Left Arm, Patient Position: Sitting)   Pulse 91   Temp 98.3 F (36.8 C) (Oral)   Ht 5' 5 (1.651 m)   Wt 148 lb 6.4 oz (67.3 kg)   SpO2 97%   BMI 24.70 kg/m   Wt Readings from Last 3 Encounters:  12/17/23 148 lb 6.4 oz (67.3 kg)  10/02/23 148 lb 4 oz (67.2 kg)  05/21/23 153 lb (69.4 kg)    Physical Exam Vitals and nursing note reviewed.  Constitutional:      General: He is awake. He is not in acute distress.    Appearance: He is well-developed and well-groomed. He is not ill-appearing or toxic-appearing.  HENT:     Head: Normocephalic and atraumatic.     Right Ear: Hearing, tympanic membrane, ear canal and external ear normal. No drainage.     Left Ear: Hearing, tympanic membrane, ear canal and external ear normal. No drainage.     Nose: Nose normal.     Mouth/Throat:     Pharynx: Uvula midline.  Eyes:     General: Lids are normal.        Right eye: No discharge.        Left eye: No discharge.     Extraocular Movements: Extraocular movements intact.     Conjunctiva/sclera: Conjunctivae normal.     Pupils: Pupils are equal, round,  and reactive to light.     Visual Fields: Right eye visual fields normal and left eye visual fields normal.  Neck:     Thyroid: No thyromegaly.     Vascular: No carotid bruit or JVD.     Trachea: Trachea normal.  Cardiovascular:     Rate and Rhythm: Normal rate and regular rhythm.     Heart sounds: S1 normal and S2 normal. Murmur heard.     Systolic murmur is present with a grade of 2/6.     No gallop.  Pulmonary:     Effort: Pulmonary effort is normal. No accessory muscle usage or respiratory distress.     Breath sounds: Normal breath sounds.  Abdominal:     General: Bowel sounds are normal.     Palpations: Abdomen is soft. There is no hepatomegaly or  splenomegaly.     Tenderness: There is no abdominal tenderness.  Musculoskeletal:        General: Normal range of motion.     Cervical back: Normal range of motion and neck supple.     Right lower leg: No edema.     Left lower leg: No edema.  Lymphadenopathy:     Head:     Right side of head: No submental, submandibular, tonsillar, preauricular or posterior auricular adenopathy.     Left side of head: No submental, submandibular, tonsillar, preauricular or posterior auricular adenopathy.     Cervical: No cervical adenopathy.  Skin:    General: Skin is warm and dry.     Capillary Refill: Capillary refill takes less than 2 seconds.     Findings: No rash.  Neurological:     Mental Status: He is alert and oriented to person, place, and time.     Gait: Gait is intact.     Deep Tendon Reflexes: Reflexes are normal and symmetric.     Reflex Scores:      Brachioradialis reflexes are 2+ on the right side and 2+ on the left side.      Patellar reflexes are 2+ on the right side and 2+ on the left side. Psychiatric:        Attention and Perception: Attention normal.        Mood and Affect: Mood normal.        Speech: Speech normal.        Behavior: Behavior normal. Behavior is cooperative.        Thought Content: Thought content normal.        Cognition and Memory: Cognition normal.     Results for orders placed or performed in visit on 08/27/23  PSA   Collection Time: 08/27/23  1:56 PM  Result Value Ref Range   Prostate Specific Ag, Serum 5.2 (H) 0.0 - 4.0 ng/mL      Assessment & Plan:   Problem List Items Addressed This Visit       Cardiovascular and Mediastinum   Mild mitral regurgitation   Followed by cardiology, continue this collaboration.      Relevant Medications   rosuvastatin  (CRESTOR ) 40 MG tablet   lisinopril  (ZESTRIL ) 40 MG tablet   Benign essential hypertension   Chronic, stable.  BP at goal in office on recheck and on average at home is within goal range.   Recommend he monitor BP at least a few mornings a week at home and document.  DASH diet at home.  Continue Lisinopril  40 MG daily, alert provider if elevations consistently >130/80. Consider 12.5 MG HCTZ if  elevations, 25 MG caused hypotension OR Amlodipine.  Labs today: CBC, TSH,CMP.        Relevant Medications   rosuvastatin  (CRESTOR ) 40 MG tablet   lisinopril  (ZESTRIL ) 40 MG tablet   Other Relevant Orders   CBC with Differential/Platelet   Comprehensive metabolic panel with GFR   TSH     Genitourinary   Prostate cancer (HCC) - Primary   Ongoing with watchful waiting.  Continue to collaborate with urology, recent labs and notes reviewed.        Other   Mixed hyperlipidemia   Chronic, stable.  Continue current medication regimen and adjust as needed. Will check lipid panel today and if LDL >55 will add on Zetia.      Relevant Medications   rosuvastatin  (CRESTOR ) 40 MG tablet   lisinopril  (ZESTRIL ) 40 MG tablet   Other Relevant Orders   Comprehensive metabolic panel with GFR   Lipid Panel w/o Chol/HDL Ratio   History of TIA (transient ischemic attack)   On 02/15/23, no further incidents and work up in ER was reassuring at the time.  Monitor closely and ensure LDL is at goal (55 or less) and BP.  Continue to collaborate with cardiology.      Absolute anemia   Ongoing, mild since June 2021. Normal iron level + negative FOBT past checks.  Is very active at baseline, suspect this may be reason for mild low levels.  Continue supplements at home and monitor.  If any significant decline will get into GI or hematology for further evaluation.  CBC, iron, ferritin today.      Relevant Orders   Ferritin   Iron   Other Visit Diagnoses       Encounter for annual physical exam       Annual physical today with labs and health maintenance reviewed, discussed with patient.       Discussed aspirin prophylaxis for myocardial infarction prevention and decision was made to continue  ASA  LABORATORY TESTING:  Health maintenance labs ordered today as discussed above.   The natural history of prostate cancer and ongoing controversy regarding screening and potential treatment outcomes of prostate cancer has been discussed with the patient. The meaning of a false positive PSA and a false negative PSA has been discussed. He indicates understanding of the limitations of this screening test and wishes to proceed with screening PSA testing. Had performed with Dr. Penne in April.   IMMUNIZATIONS:   - Tdap: Tetanus vaccination status reviewed: last tetanus booster within 10 years. - Influenza: Up to date - Pneumovax: Not applicable - Prevnar: Up To Date - Zostavax vaccine: Up To Date  SCREENING: - Colonoscopy: Not Up to date - is getting in the new year - 2026 Discussed with patient purpose of the colonoscopy is to detect colon cancer at curable precancerous or early stages   - AAA Screening: Not applicable  -Hearing Test: Not applicable  -Spirometry: Not applicable   PATIENT COUNSELING:    Sexuality: Discussed sexually transmitted diseases, partner selection, use of condoms, avoidance of unintended pregnancy  and contraceptive alternatives.   Advised to avoid cigarette smoking.  I discussed with the patient that most people either abstain from alcohol or drink within safe limits (<=14/week and <=4 drinks/occasion for males, <=7/weeks and <= 3 drinks/occasion for females) and that the risk for alcohol disorders and other health effects rises proportionally with the number of drinks per week and how often a drinker exceeds daily limits.  Discussed cessation/primary prevention of  drug use and availability of treatment for abuse.   Diet: Encouraged to adjust caloric intake to maintain  or achieve ideal body weight, to reduce intake of dietary saturated fat and total fat, to limit sodium intake by avoiding high sodium foods and not adding table salt, and to maintain adequate  dietary potassium and calcium  preferably from fresh fruits, vegetables, and low-fat dairy products.    Stressed the importance of regular exercise  Injury prevention: Discussed safety belts, safety helmets, smoke detector, smoking near bedding or upholstery.   Dental health: Discussed importance of regular tooth brushing, flossing, and dental visits.   Follow up plan: NEXT PREVENTATIVE PHYSICAL DUE IN 1 YEAR. Return in about 6 months (around 06/18/2024) for HTN/HLD and Prostate CA.

## 2023-12-17 NOTE — Assessment & Plan Note (Signed)
 Chronic, stable.  Continue current medication regimen and adjust as needed. Will check lipid panel today and if LDL >55 will add on Zetia.

## 2023-12-17 NOTE — Assessment & Plan Note (Addendum)
 Chronic, stable.  BP at goal in office on recheck and on average at home is within goal range.  Recommend he monitor BP at least a few mornings a week at home and document.  DASH diet at home.  Continue Lisinopril  40 MG daily, alert provider if elevations consistently >130/80. Consider 12.5 MG HCTZ if elevations, 25 MG caused hypotension OR Amlodipine.  Labs today: CBC, TSH,CMP.

## 2023-12-18 ENCOUNTER — Ambulatory Visit: Payer: Self-pay | Admitting: Nurse Practitioner

## 2023-12-18 LAB — COMPREHENSIVE METABOLIC PANEL WITH GFR
ALT: 11 IU/L (ref 0–44)
AST: 32 IU/L (ref 0–40)
Albumin: 4.7 g/dL (ref 3.9–4.9)
Alkaline Phosphatase: 59 IU/L (ref 44–121)
BUN/Creatinine Ratio: 20 (ref 10–24)
BUN: 22 mg/dL (ref 8–27)
Bilirubin Total: 0.4 mg/dL (ref 0.0–1.2)
CO2: 18 mmol/L — ABNORMAL LOW (ref 20–29)
Calcium: 8.9 mg/dL (ref 8.6–10.2)
Chloride: 102 mmol/L (ref 96–106)
Creatinine, Ser: 1.12 mg/dL (ref 0.76–1.27)
Globulin, Total: 2.1 g/dL (ref 1.5–4.5)
Glucose: 84 mg/dL (ref 70–99)
Potassium: 4.6 mmol/L (ref 3.5–5.2)
Sodium: 139 mmol/L (ref 134–144)
Total Protein: 6.8 g/dL (ref 6.0–8.5)
eGFR: 72 mL/min/1.73 (ref >59)

## 2023-12-18 LAB — CBC WITH DIFFERENTIAL/PLATELET
Basophils Absolute: 0 x10E3/uL (ref 0.0–0.2)
Basos: 1 %
EOS (ABSOLUTE): 0 x10E3/uL (ref 0.0–0.4)
Eos: 1 %
Hematocrit: 41.8 % (ref 37.5–51.0)
Hemoglobin: 13.8 g/dL (ref 13.0–17.7)
Immature Grans (Abs): 0 x10E3/uL (ref 0.0–0.1)
Immature Granulocytes: 0 %
Lymphocytes Absolute: 1 x10E3/uL (ref 0.7–3.1)
Lymphs: 16 %
MCH: 33.8 pg — ABNORMAL HIGH (ref 26.6–33.0)
MCHC: 33 g/dL (ref 31.5–35.7)
MCV: 103 fL — ABNORMAL HIGH (ref 79–97)
Monocytes Absolute: 0.4 x10E3/uL (ref 0.1–0.9)
Monocytes: 6 %
Neutrophils Absolute: 4.8 x10E3/uL (ref 1.4–7.0)
Neutrophils: 75 %
Platelets: 221 x10E3/uL (ref 150–450)
RBC: 4.08 x10E6/uL — ABNORMAL LOW (ref 4.14–5.80)
RDW: 12.9 % (ref 11.6–15.4)
WBC: 6.3 x10E3/uL (ref 3.4–10.8)

## 2023-12-18 LAB — TSH: TSH: 1.6 u[IU]/mL (ref 0.450–4.500)

## 2023-12-18 LAB — IRON: Iron: 140 ug/dL (ref 38–169)

## 2023-12-18 LAB — LIPID PANEL W/O CHOL/HDL RATIO
Cholesterol, Total: 199 mg/dL (ref 100–199)
HDL: 72 mg/dL (ref >39)
LDL Chol Calc (NIH): 106 mg/dL — ABNORMAL HIGH (ref 0–99)
Triglycerides: 121 mg/dL (ref 0–149)
VLDL Cholesterol Cal: 21 mg/dL (ref 5–40)

## 2023-12-18 LAB — FERRITIN: Ferritin: 388 ng/mL (ref 30–400)

## 2023-12-18 NOTE — Progress Notes (Signed)
 Contacted via MyChart  Good morning Warren Richardson, your labs have returned and overall remain stable. - Kidney function, creatinine and eGFR, remains normal, as is liver function, AST and ALT.  - Anemia is stable. - LDL, bad cholesterol, has trended up.  Are you taking Rosuvastatin  40 MG daily? Let me know.  This is important for stroke prevention. Any questions? Keep being stellar!!  Thank you for allowing me to participate in your care.  I appreciate you. Kindest regards, Amiya Escamilla

## 2024-03-28 ENCOUNTER — Other Ambulatory Visit

## 2024-03-28 DIAGNOSIS — C61 Malignant neoplasm of prostate: Secondary | ICD-10-CM

## 2024-03-29 LAB — PSA: Prostate Specific Ag, Serum: 5.3 ng/mL — ABNORMAL HIGH (ref 0.0–4.0)

## 2024-04-09 ENCOUNTER — Ambulatory Visit: Admitting: Urology

## 2024-04-09 ENCOUNTER — Encounter: Payer: Self-pay | Admitting: Urology

## 2024-04-09 VITALS — BP 134/74 | HR 80 | Ht 66.0 in | Wt 150.0 lb

## 2024-04-09 DIAGNOSIS — C61 Malignant neoplasm of prostate: Secondary | ICD-10-CM | POA: Diagnosis not present

## 2024-04-09 DIAGNOSIS — N401 Enlarged prostate with lower urinary tract symptoms: Secondary | ICD-10-CM

## 2024-04-09 DIAGNOSIS — R350 Frequency of micturition: Secondary | ICD-10-CM | POA: Diagnosis not present

## 2024-04-09 NOTE — Progress Notes (Unsigned)
 04/09/2024 1:40 PM   Warren Richardson 25-Jun-1955 [id number]  Referring provider: Valerio Melanie DASEN, NP [address] [place],  [place] [address]  Chief Complaint  Patient presents with   Prostate Cancer   Urologic history: 1.  Prostate cancer Biopsy 01/24/2023: PSA 5.8 Pathology: 2/12 cores Gleason 3+3; LLB, LLM (9%, 18%); 2 cores ASAP and 1 core high-grade PIN. Elected active surveillance Prostate MRI 08/27/23: 40 cc volume; no high-grade or indeterminate lesions  2.  BPH with LUTS Tamsulosin  0.4 mg not effective  HPI: Warren Richardson is a 68 y.o. male presents for 19-month prostate cancer follow-up.  No complaints since last visit with Dr. Penne He does have urinary frequency, urgency, hesitancy and a weak urinary stream.  Was given a trial of tamsulosin  by Dr. Penne which he states was not effective Denies flank, abdominal or pelvic pain PSA 03/28/2024 stable at 5.3   PSA trend    Prostate Specific Ag, Serum  Latest Ref Rng 0.0 - 4.0 ng/mL  11/05/2018 3.1  11/07/2019 3.6  11/12/2020 3.7  11/14/2021 3.2  11/16/2022 5.8  12/15/2022 5.4  03/28/2024 5.3    PMH: Past Medical History:  Diagnosis Date   Hyperlipidemia    Hypertension     Surgical History: No past surgical history on file.  Home Medications:  Allergies as of 04/09/2024       Reactions   Atorvastatin Other (See Comments)   Increased liver enzymes        Medication List        Accurate as of April 09, 2024  1:40 PM. If you have any questions, ask your nurse or doctor.          aspirin 81 MG chewable tablet Chew 81 mg by mouth daily.   BIOTIN PO Take 250 mg by mouth daily.   BLACK ELDERBERRY PO Take by mouth.   Fish Oil 1000 MG Caps Take 1,000 mg by mouth 2 (two) times daily.   GINGER ROOT PO 1 Dose once daily   glucosamine-chondroitin 500-400 MG tablet Take 1 tablet by mouth 2 (two) times daily.   lisinopril  40 MG tablet Commonly known as: ZESTRIL  Take 1 tablet (40 mg  total) by mouth daily.   Multi-Vitamins Tabs Take 1 tablet by mouth daily.   rosuvastatin  40 MG tablet Commonly known as: CRESTOR  Take 1 tablet (40 mg total) by mouth daily.   SAW PALMETTO PO Take by mouth.        Allergies:  Allergies  Allergen Reactions   Atorvastatin Other (See Comments)    Increased liver enzymes    Family History: Family History  Problem Relation Age of Onset   Arthritis Mother    Stroke Mother    Dementia Mother    Cancer Father        prostate   Diabetes Father    Hyperlipidemia Father    Hypertension Father    Cancer Sister        breast    Social History:  reports that he has never smoked. He has never been exposed to tobacco smoke. He has never used smokeless tobacco. He reports current alcohol use of about 1.0 - 2.0 standard drink of alcohol per week. He reports that he does not use drugs.   Physical Exam: BP 134/74   Pulse 80   Ht 5' 6 (1.676 m)   Wt 150 lb (68 kg)   BMI 24.21 kg/m   Constitutional:  Alert, No acute distress. HEENT:  [place] AT Respiratory: Normal respiratory effort, no increased work of breathing. GU: Prostate 40 g, smooth without nodules Psychiatric: Normal mood and affect.   Assessment & Plan:    1.  T1c low risk prostate cancer Benign DRE Stable PSA We discussed the recommendation of a confirmatory biopsy within 1-2 years of the initial biopsy.  Recent MRI was negative and he does not desire confirmatory biopsy as long as his PSA and exam are stable Lab visit 6 months PSA schedule 1 year follow-up PSA/DRE  2.  BPH with LUTS Not interested in additional medications or surgical management at this time   Glendia JAYSON Barba, MD  [facility] [address] [place], [place] [address] [phone]

## 2024-06-18 ENCOUNTER — Ambulatory Visit: Admitting: Nurse Practitioner

## 2024-10-08 ENCOUNTER — Other Ambulatory Visit

## 2025-04-06 ENCOUNTER — Other Ambulatory Visit

## 2025-04-10 ENCOUNTER — Ambulatory Visit: Admitting: Urology
